# Patient Record
Sex: Female | Born: 1975
Health system: Southern US, Community
[De-identification: ages and names within clinical notes are randomized; demographics above are authoritative.]

## PROBLEM LIST (undated history)

## (undated) ENCOUNTER — Emergency Department (HOSPITAL_COMMUNITY): Admission: EM | Disposition: A | Discharge status: Home / Self Care | Payer: Self-pay

## (undated) DIAGNOSIS — M419 Scoliosis, unspecified: Secondary | ICD-10-CM

## (undated) DIAGNOSIS — R519 Headache, unspecified: Secondary | ICD-10-CM

## (undated) DIAGNOSIS — B009 Herpesviral infection, unspecified: Secondary | ICD-10-CM

## (undated) DIAGNOSIS — A630 Anogenital (venereal) warts: Secondary | ICD-10-CM

## (undated) DIAGNOSIS — R51 Headache: Secondary | ICD-10-CM

## (undated) DIAGNOSIS — S32009A Unspecified fracture of unspecified lumbar vertebra, initial encounter for closed fracture: Secondary | ICD-10-CM

## (undated) DIAGNOSIS — F909 Attention-deficit hyperactivity disorder, unspecified type: Secondary | ICD-10-CM

## (undated) DIAGNOSIS — N63 Unspecified lump in unspecified breast: Secondary | ICD-10-CM

## (undated) HISTORY — DX: Scoliosis, unspecified: M41.9

## (undated) HISTORY — DX: Unspecified lump in unspecified breast: N63.0

## (undated) HISTORY — DX: Anogenital (venereal) warts: A63.0

## (undated) HISTORY — DX: Attention-deficit hyperactivity disorder, unspecified type: F90.9

## (undated) HISTORY — DX: Unspecified fracture of unspecified lumbar vertebra, initial encounter for closed fracture: S32.009A

## (undated) HISTORY — DX: Headache: R51

## (undated) HISTORY — DX: Headache, unspecified: R51.9

## (undated) HISTORY — DX: Herpesviral infection, unspecified: B00.9

---
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
## 2004-04-04 ENCOUNTER — Ambulatory Visit: Payer: Self-pay | Admitting: Family Medicine

## 2004-04-28 ENCOUNTER — Other Ambulatory Visit: Admission: RE | Admit: 2004-04-28 | Discharge: 2004-04-28 | Payer: Self-pay | Admitting: Family Medicine

## 2004-04-28 ENCOUNTER — Ambulatory Visit: Payer: Self-pay | Admitting: Family Medicine

## 2004-05-01 ENCOUNTER — Encounter: Admission: RE | Admit: 2004-05-01 | Discharge: 2004-05-01 | Payer: Self-pay | Admitting: Family Medicine

## 2005-05-14 ENCOUNTER — Ambulatory Visit: Payer: Self-pay | Admitting: Family Medicine

## 2005-05-22 ENCOUNTER — Encounter: Admission: RE | Admit: 2005-05-22 | Discharge: 2005-05-22 | Payer: Self-pay | Admitting: Family Medicine

## 2005-06-04 ENCOUNTER — Ambulatory Visit: Payer: Self-pay | Admitting: Family Medicine

## 2005-06-10 ENCOUNTER — Encounter: Payer: Self-pay | Admitting: Family Medicine

## 2005-06-10 ENCOUNTER — Ambulatory Visit: Payer: Self-pay | Admitting: Family Medicine

## 2005-06-10 ENCOUNTER — Other Ambulatory Visit: Admission: RE | Admit: 2005-06-10 | Discharge: 2005-06-10 | Payer: Self-pay | Admitting: Family Medicine

## 2005-06-25 ENCOUNTER — Ambulatory Visit: Payer: Self-pay | Admitting: Family Medicine

## 2006-05-24 ENCOUNTER — Encounter: Admission: RE | Admit: 2006-05-24 | Discharge: 2006-05-24 | Payer: Self-pay | Admitting: Family Medicine

## 2006-06-07 ENCOUNTER — Ambulatory Visit: Payer: Self-pay | Admitting: Family Medicine

## 2006-06-07 LAB — CONVERTED CEMR LAB
ALT: 15 units/L (ref 0–40)
AST: 17 units/L (ref 0–37)
Albumin: 3.8 g/dL (ref 3.5–5.2)
Alkaline Phosphatase: 33 units/L — ABNORMAL LOW (ref 39–117)
BUN: 9 mg/dL (ref 6–23)
Basophils Absolute: 0.1 10*3/uL (ref 0.0–0.1)
Basophils Relative: 1 % (ref 0.0–1.0)
CO2: 29 meq/L (ref 19–32)
Calcium: 9.5 mg/dL (ref 8.4–10.5)
Chloride: 107 meq/L (ref 96–112)
Chol/HDL Ratio, serum: 5.2
Cholesterol: 254 mg/dL (ref 0–200)
Creatinine, Ser: 0.8 mg/dL (ref 0.4–1.2)
Eosinophil percent: 1.4 % (ref 0.0–5.0)
GFR calc non Af Amer: 91 mL/min
Glomerular Filtration Rate, Af Am: 110 mL/min/{1.73_m2}
Glucose, Bld: 86 mg/dL (ref 70–99)
HCT: 38.4 % (ref 36.0–46.0)
HDL: 48.4 mg/dL (ref >39.0)
Hemoglobin: 12.7 g/dL (ref 12.0–15.0)
LDL DIRECT: 151.9 mg/dL
Lymphocytes Relative: 40.9 % (ref 12.0–46.0)
MCHC: 33.1 g/dL (ref 30.0–36.0)
MCV: 91.8 fL (ref 78.0–100.0)
Monocytes Absolute: 0.5 10*3/uL (ref 0.2–0.7)
Monocytes Relative: 6.3 % (ref 3.0–11.0)
Neutro Abs: 3.7 10*3/uL (ref 1.4–7.7)
Neutrophils Relative %: 50.4 % (ref 43.0–77.0)
Platelets: 286 10*3/uL (ref 150–400)
Potassium: 4.2 meq/L (ref 3.5–5.1)
RBC: 4.19 M/uL (ref 3.87–5.11)
RDW: 13 % (ref 11.5–14.6)
Sodium: 142 meq/L (ref 135–145)
TSH: 2.21 microintl units/mL (ref 0.35–5.50)
Total Bilirubin: 0.5 mg/dL (ref 0.3–1.2)
Total Protein: 6.9 g/dL (ref 6.0–8.3)
Triglyceride fasting, serum: 295 mg/dL (ref 0–149)
VLDL: 59 mg/dL — ABNORMAL HIGH (ref 0–40)
WBC: 7.4 10*3/uL (ref 4.5–10.5)

## 2006-06-14 ENCOUNTER — Encounter: Payer: Self-pay | Admitting: Family Medicine

## 2006-06-14 ENCOUNTER — Ambulatory Visit: Payer: Self-pay | Admitting: Family Medicine

## 2006-06-14 ENCOUNTER — Other Ambulatory Visit: Admission: RE | Admit: 2006-06-14 | Discharge: 2006-06-14 | Payer: Self-pay | Admitting: Family Medicine

## 2006-11-29 ENCOUNTER — Ambulatory Visit: Payer: Self-pay | Admitting: Family Medicine

## 2006-11-29 LAB — CONVERTED CEMR LAB
Cholesterol: 227 mg/dL (ref 0–200)
Direct LDL: 134 mg/dL
HDL: 43.3 mg/dL (ref >39.0)
Total CHOL/HDL Ratio: 5.2
Triglycerides: 283 mg/dL (ref 0–149)
VLDL: 57 mg/dL — ABNORMAL HIGH (ref 0–40)

## 2007-06-03 ENCOUNTER — Encounter: Admission: RE | Admit: 2007-06-03 | Discharge: 2007-06-03 | Payer: Self-pay | Admitting: Family Medicine

## 2007-06-03 ENCOUNTER — Encounter: Payer: Self-pay | Admitting: Family Medicine

## 2007-06-09 ENCOUNTER — Telehealth: Payer: Self-pay | Admitting: Family Medicine

## 2007-06-17 ENCOUNTER — Encounter: Payer: Self-pay | Admitting: Internal Medicine

## 2007-06-17 ENCOUNTER — Ambulatory Visit: Payer: Self-pay | Admitting: Family Medicine

## 2007-06-17 ENCOUNTER — Other Ambulatory Visit: Admission: RE | Admit: 2007-06-17 | Discharge: 2007-06-17 | Payer: Self-pay | Admitting: Internal Medicine

## 2007-06-20 ENCOUNTER — Telehealth: Payer: Self-pay | Admitting: Family Medicine

## 2007-06-20 LAB — CONVERTED CEMR LAB
ALT: 20 units/L (ref 0–35)
AST: 18 units/L (ref 0–37)
Albumin: 4.2 g/dL (ref 3.5–5.2)
Alkaline Phosphatase: 35 units/L — ABNORMAL LOW (ref 39–117)
BUN: 8 mg/dL (ref 6–23)
Basophils Absolute: 0 10*3/uL (ref 0.0–0.1)
Basophils Relative: 0.6 % (ref 0.0–1.0)
Bilirubin, Direct: 0.1 mg/dL (ref 0.0–0.3)
CO2: 29 meq/L (ref 19–32)
Calcium: 10.1 mg/dL (ref 8.4–10.5)
Chloride: 106 meq/L (ref 96–112)
Cholesterol: 215 mg/dL (ref 0–200)
Creatinine, Ser: 0.9 mg/dL (ref 0.4–1.2)
Direct LDL: 145.9 mg/dL
Eosinophils Absolute: 0.1 10*3/uL (ref 0.0–0.6)
Eosinophils Relative: 0.7 % (ref 0.0–5.0)
GFR calc Af Amer: 95 mL/min
GFR calc non Af Amer: 79 mL/min
Glucose, Bld: 80 mg/dL (ref 70–99)
HCT: 34.8 % — ABNORMAL LOW (ref 36.0–46.0)
HDL: 46 mg/dL (ref >39.0)
Hemoglobin: 12.2 g/dL (ref 12.0–15.0)
Lymphocytes Relative: 33.9 % (ref 12.0–46.0)
MCHC: 35.1 g/dL (ref 30.0–36.0)
MCV: 92 fL (ref 78.0–100.0)
Monocytes Absolute: 0.5 10*3/uL (ref 0.2–0.7)
Monocytes Relative: 6.3 % (ref 3.0–11.0)
Neutro Abs: 4.2 10*3/uL (ref 1.4–7.7)
Neutrophils Relative %: 58.5 % (ref 43.0–77.0)
Platelets: 343 10*3/uL (ref 150–400)
Potassium: 4.8 meq/L (ref 3.5–5.1)
RBC: 3.78 M/uL — ABNORMAL LOW (ref 3.87–5.11)
RDW: 12.1 % (ref 11.5–14.6)
Sodium: 143 meq/L (ref 135–145)
TSH: 1.35 microintl units/mL (ref 0.35–5.50)
Total Bilirubin: 0.7 mg/dL (ref 0.3–1.2)
Total CHOL/HDL Ratio: 4.7
Total Protein: 6.8 g/dL (ref 6.0–8.3)
Triglycerides: 108 mg/dL (ref 0–149)
VLDL: 22 mg/dL (ref 0–40)
WBC: 7.3 10*3/uL (ref 4.5–10.5)

## 2007-06-24 ENCOUNTER — Ambulatory Visit: Payer: Self-pay | Admitting: Family Medicine

## 2007-06-24 DIAGNOSIS — J029 Acute pharyngitis, unspecified: Secondary | ICD-10-CM | POA: Insufficient documentation

## 2007-06-24 LAB — CONVERTED CEMR LAB: Rapid Strep: POSITIVE

## 2008-03-01 ENCOUNTER — Ambulatory Visit: Payer: Self-pay | Admitting: Family Medicine

## 2008-03-01 DIAGNOSIS — R209 Unspecified disturbances of skin sensation: Secondary | ICD-10-CM

## 2008-03-07 ENCOUNTER — Encounter: Payer: Self-pay | Admitting: Family Medicine

## 2008-04-04 ENCOUNTER — Telehealth: Payer: Self-pay | Admitting: Family Medicine

## 2008-06-18 ENCOUNTER — Encounter: Admission: RE | Admit: 2008-06-18 | Discharge: 2008-06-18 | Payer: Self-pay | Admitting: Family Medicine

## 2008-06-18 ENCOUNTER — Other Ambulatory Visit: Admission: RE | Admit: 2008-06-18 | Discharge: 2008-06-18 | Payer: Self-pay | Admitting: Family Medicine

## 2008-06-18 ENCOUNTER — Encounter: Payer: Self-pay | Admitting: Family Medicine

## 2008-06-18 ENCOUNTER — Ambulatory Visit: Payer: Self-pay | Admitting: Family Medicine

## 2008-06-22 ENCOUNTER — Encounter: Payer: Self-pay | Admitting: Family Medicine

## 2008-06-23 LAB — CONVERTED CEMR LAB
ALT: 13 units/L (ref 0–35)
AST: 17 units/L (ref 0–37)
Albumin: 4.2 g/dL (ref 3.5–5.2)
Alkaline Phosphatase: 32 units/L — ABNORMAL LOW (ref 39–117)
BUN: 9 mg/dL (ref 6–23)
Basophils Absolute: 0.1 10*3/uL (ref 0.0–0.1)
Basophils Relative: 1.4 % (ref 0.0–3.0)
Bilirubin, Direct: 0.1 mg/dL (ref 0.0–0.3)
CO2: 31 meq/L (ref 19–32)
Calcium: 9.8 mg/dL (ref 8.4–10.5)
Chloride: 105 meq/L (ref 96–112)
Cholesterol: 203 mg/dL (ref 0–200)
Creatinine, Ser: 0.8 mg/dL (ref 0.4–1.2)
Direct LDL: 123.2 mg/dL
Eosinophils Absolute: 0.1 10*3/uL (ref 0.0–0.7)
Eosinophils Relative: 1.1 % (ref 0.0–5.0)
GFR calc Af Amer: 108 mL/min
GFR calc non Af Amer: 90 mL/min
Glucose, Bld: 88 mg/dL (ref 70–99)
HCT: 38 % (ref 36.0–46.0)
HDL: 46.3 mg/dL (ref >39.0)
Hemoglobin: 13.1 g/dL (ref 12.0–15.0)
Lymphocytes Relative: 38.4 % (ref 12.0–46.0)
MCHC: 34.4 g/dL (ref 30.0–36.0)
MCV: 93.6 fL (ref 78.0–100.0)
Monocytes Absolute: 0.4 10*3/uL (ref 0.1–1.0)
Monocytes Relative: 6 % (ref 3.0–12.0)
Neutro Abs: 4 10*3/uL (ref 1.4–7.7)
Neutrophils Relative %: 53.1 % (ref 43.0–77.0)
Platelets: 296 10*3/uL (ref 150–400)
Potassium: 4.8 meq/L (ref 3.5–5.1)
RBC: 4.06 M/uL (ref 3.87–5.11)
RDW: 12.5 % (ref 11.5–14.6)
Sodium: 142 meq/L (ref 135–145)
TSH: 1.94 microintl units/mL (ref 0.35–5.50)
Total Bilirubin: 0.9 mg/dL (ref 0.3–1.2)
Total CHOL/HDL Ratio: 4.4
Total Protein: 7.4 g/dL (ref 6.0–8.3)
Triglycerides: 208 mg/dL (ref 0–149)
VLDL: 42 mg/dL — ABNORMAL HIGH (ref 0–40)
WBC: 7.5 10*3/uL (ref 4.5–10.5)

## 2008-07-06 ENCOUNTER — Telehealth: Payer: Self-pay | Admitting: Internal Medicine

## 2008-08-10 ENCOUNTER — Telehealth: Payer: Self-pay | Admitting: Family Medicine

## 2008-08-15 ENCOUNTER — Ambulatory Visit: Payer: Self-pay | Admitting: Family Medicine

## 2008-08-15 DIAGNOSIS — IMO0002 Reserved for concepts with insufficient information to code with codable children: Secondary | ICD-10-CM | POA: Insufficient documentation

## 2008-10-30 ENCOUNTER — Telehealth (INDEPENDENT_AMBULATORY_CARE_PROVIDER_SITE_OTHER): Payer: Self-pay | Admitting: *Deleted

## 2008-10-30 ENCOUNTER — Ambulatory Visit: Payer: Self-pay | Admitting: Family Medicine

## 2008-10-30 DIAGNOSIS — N63 Unspecified lump in unspecified breast: Secondary | ICD-10-CM

## 2008-11-01 ENCOUNTER — Encounter: Admission: RE | Admit: 2008-11-01 | Discharge: 2008-11-01 | Payer: Self-pay | Admitting: Family Medicine

## 2008-11-05 ENCOUNTER — Telehealth: Payer: Self-pay | Admitting: Family Medicine

## 2008-11-29 ENCOUNTER — Encounter: Payer: Self-pay | Admitting: Family Medicine

## 2009-03-11 ENCOUNTER — Telehealth: Payer: Self-pay | Admitting: Family Medicine

## 2009-04-29 ENCOUNTER — Ambulatory Visit: Payer: Self-pay | Admitting: Family Medicine

## 2009-04-29 DIAGNOSIS — M4120 Other idiopathic scoliosis, site unspecified: Secondary | ICD-10-CM

## 2009-04-29 DIAGNOSIS — M419 Scoliosis, unspecified: Secondary | ICD-10-CM | POA: Insufficient documentation

## 2009-04-29 DIAGNOSIS — M545 Low back pain, unspecified: Secondary | ICD-10-CM | POA: Insufficient documentation

## 2009-04-29 LAB — CONVERTED CEMR LAB
Bilirubin Urine: NEGATIVE
Glucose, Urine, Semiquant: NEGATIVE
Nitrite: NEGATIVE
Protein, U semiquant: NEGATIVE
Specific Gravity, Urine: 1.02
Urobilinogen, UA: 1
pH: 7

## 2009-07-31 ENCOUNTER — Other Ambulatory Visit: Admission: RE | Admit: 2009-07-31 | Discharge: 2009-07-31 | Payer: Self-pay | Admitting: Family Medicine

## 2009-07-31 ENCOUNTER — Ambulatory Visit: Payer: Self-pay | Admitting: Family Medicine

## 2009-07-31 LAB — HM PAP SMEAR

## 2009-08-01 LAB — CONVERTED CEMR LAB
ALT: 14 units/L (ref 0–35)
AST: 22 units/L (ref 0–37)
Albumin: 4.5 g/dL (ref 3.5–5.2)
Alkaline Phosphatase: 38 units/L — ABNORMAL LOW (ref 39–117)
BUN: 9 mg/dL (ref 6–23)
Basophils Absolute: 0.1 10*3/uL (ref 0.0–0.1)
Basophils Relative: 0.7 % (ref 0.0–3.0)
Bilirubin, Direct: 0.1 mg/dL (ref 0.0–0.3)
CO2: 30 meq/L (ref 19–32)
Calcium: 9.8 mg/dL (ref 8.4–10.5)
Chloride: 106 meq/L (ref 96–112)
Cholesterol: 207 mg/dL — ABNORMAL HIGH (ref 0–200)
Creatinine, Ser: 0.8 mg/dL (ref 0.4–1.2)
Direct LDL: 142.4 mg/dL
Eosinophils Absolute: 0.1 10*3/uL (ref 0.0–0.7)
Eosinophils Relative: 0.8 % (ref 0.0–5.0)
GFR calc non Af Amer: 88.72 mL/min (ref >60)
Glucose, Bld: 83 mg/dL (ref 70–99)
HCT: 38.7 % (ref 36.0–46.0)
HDL: 59.2 mg/dL (ref >39.00)
Hemoglobin: 13.1 g/dL (ref 12.0–15.0)
Lymphocytes Relative: 33.6 % (ref 12.0–46.0)
Lymphs Abs: 3.6 10*3/uL (ref 0.7–4.0)
MCHC: 33.7 g/dL (ref 30.0–36.0)
MCV: 96 fL (ref 78.0–100.0)
Monocytes Absolute: 0.6 10*3/uL (ref 0.1–1.0)
Monocytes Relative: 5.5 % (ref 3.0–12.0)
Neutro Abs: 6.2 10*3/uL (ref 1.4–7.7)
Neutrophils Relative %: 59.4 % (ref 43.0–77.0)
Platelets: 271 10*3/uL (ref 150.0–400.0)
Potassium: 4 meq/L (ref 3.5–5.1)
RBC: 4.03 M/uL (ref 3.87–5.11)
RDW: 13.1 % (ref 11.5–14.6)
Sodium: 143 meq/L (ref 135–145)
TSH: 1.34 microintl units/mL (ref 0.35–5.50)
Total Bilirubin: 0.7 mg/dL (ref 0.3–1.2)
Total CHOL/HDL Ratio: 3
Total Protein: 7.4 g/dL (ref 6.0–8.3)
Triglycerides: 93 mg/dL (ref 0.0–149.0)
VLDL: 18.6 mg/dL (ref 0.0–40.0)
WBC: 10.6 10*3/uL — ABNORMAL HIGH (ref 4.5–10.5)

## 2009-08-06 ENCOUNTER — Encounter: Payer: Self-pay | Admitting: Family Medicine

## 2009-08-06 LAB — CONVERTED CEMR LAB: Pap Smear: NEGATIVE

## 2009-08-26 ENCOUNTER — Telehealth: Payer: Self-pay | Admitting: Family Medicine

## 2009-10-10 ENCOUNTER — Ambulatory Visit: Payer: Self-pay | Admitting: Family Medicine

## 2009-10-10 ENCOUNTER — Telehealth: Payer: Self-pay | Admitting: Family Medicine

## 2009-10-10 DIAGNOSIS — F909 Attention-deficit hyperactivity disorder, unspecified type: Secondary | ICD-10-CM | POA: Insufficient documentation

## 2009-10-31 ENCOUNTER — Ambulatory Visit: Payer: Self-pay | Admitting: Family Medicine

## 2010-02-19 ENCOUNTER — Encounter: Admission: RE | Admit: 2010-02-19 | Discharge: 2010-02-19 | Payer: Self-pay | Admitting: Family Medicine

## 2010-02-19 LAB — HM MAMMOGRAPHY

## 2010-02-27 ENCOUNTER — Telehealth: Payer: Self-pay | Admitting: Family Medicine

## 2010-03-05 ENCOUNTER — Ambulatory Visit: Payer: Self-pay | Admitting: Family Medicine

## 2010-03-05 DIAGNOSIS — F411 Generalized anxiety disorder: Secondary | ICD-10-CM | POA: Insufficient documentation

## 2010-03-31 ENCOUNTER — Telehealth: Payer: Self-pay | Admitting: Family Medicine

## 2010-06-21 ENCOUNTER — Encounter: Payer: Self-pay | Admitting: Family Medicine

## 2010-06-22 ENCOUNTER — Encounter: Payer: Self-pay | Admitting: Family Medicine

## 2010-07-03 NOTE — Progress Notes (Signed)
Summary: Patient swelling after alcohol consumption  Phone Note Call from Patient Call back at Home Phone [phone]   Caller: Patient Reason for Call: Talk to Nurse Summary of Call: Patient has a quetion regarding swelling of eyes and lips after consumption of alcohol. Initial call taken by: Everrett Coombe,  August 26, 2009 8:59 AM  Follow-up for Phone Call        almost every time she has alcohol in the last few months- liquor and especially berr, she wakes up the next morning with swollen lips and eyelids. Does this sound like allergy?  No trouble breathing or rash. Follow-up by: Raechel Ache, RN,  August 26, 2009 9:27 AM  Additional Follow-up for Phone Call Additional follow up Details #1::        yes it does sound like an allergic reaction. The best advice or course is to stop drinking alcohol, but if she wants to try an experiment she could take some Benadryl the next time to prevent this Additional Follow-up by: Nelwyn Salisbury MD,  August 26, 2009 9:38 AM    Additional Follow-up for Phone Call Additional follow up Details #2::    called. Follow-up by: Raechel Ache, RN,  August 26, 2009 9:47 AM

## 2010-07-03 NOTE — Assessment & Plan Note (Signed)
Summary: cpx-pap-will fast//ccm   Vital Signs:  Patient profile:   35 year old female Menstrual status:  regular LMP:     07/08/2009 Height:      65 inches Weight:      137 pounds BMI:     22.88 Temp:     98.3 degrees F oral Pulse rate:   84 / minute Pulse rhythm:   regular BP sitting:   114 / 76  (left arm) Cuff size:   regular  Vitals Entered By: Alfred Levins, CMA (July 31, 2009 9:14 AM) CC: cpx with pap, fasting LMP (date): 07/08/2009     Menstrual Status regular Enter LMP: 07/08/2009 Last PAP Result NEGATIVE FOR INTRAEPITHELIAL LESIONS OR MALIGNANCY.   History of Present Illness: 35 yr old female for a cpx. She feels great and has no concerns. She had some back pain last year, and we gave her some etodolac to try. This was very effective, and she uses this once in awhile. She is doing the P90X workout program at home, and she tolerates this well. After her breast lumpappeared and then disappeared last year, she has been doing frequent self breast exams. She has never felt any lumps since then.   Current Medications (verified): 1)  Etodolac 500 Mg Tabs (Etodolac) .... Two Times A Day As Needed Pain  Allergies (verified): No Known Drug Allergies  Past History:  Past Medical History: svd x 2 fractured lumbar vertebra 1995 scoliosis left breast nodule by exam and [place] in July 2010, seen by Dr. Dwain Sarna, resolved on its own  Past Surgical History: Reviewed history from 06/17/2007 and no changes required. Denies surgical history  Family History: Reviewed history from 10/30/2008 and no changes required. Family History of Alcoholism/Addiction Family History Breast cancer 1st degree relative <50 (mother at age                                                                9 and an aunt at age 17)  Family History High cholesterol  Social History: Reviewed history from 06/17/2007 and no changes required. Divorced Current Smoker Alcohol use-yes  Review of  Systems  The patient denies anorexia, fever, weight loss, weight gain, vision loss, decreased hearing, hoarseness, chest pain, syncope, dyspnea on exertion, peripheral edema, prolonged cough, headaches, hemoptysis, abdominal pain, melena, hematochezia, severe indigestion/heartburn, hematuria, incontinence, genital sores, muscle weakness, suspicious skin lesions, transient blindness, difficulty walking, depression, unusual weight change, abnormal bleeding, enlarged lymph nodes, angioedema, breast masses, and testicular masses.    Physical Exam  General:  Well-developed,well-nourished,in no acute distress; alert,appropriate and cooperative throughout examination Head:  Normocephalic and atraumatic without obvious abnormalities. No apparent alopecia or balding. Eyes:  No corneal or conjunctival inflammation noted. EOMI. Perrla. Funduscopic exam benign, without hemorrhages, exudates or papilledema. Vision grossly normal. Ears:  External ear exam shows no significant lesions or deformities.  Otoscopic examination reveals clear canals, tympanic membranes are intact bilaterally without bulging, retraction, inflammation or discharge. Hearing is grossly normal bilaterally. Nose:  External nasal examination shows no deformity or inflammation. Nasal mucosa are pink and moist without lesions or exudates. Mouth:  Oral mucosa and oropharynx without lesions or exudates.  Teeth in good repair. Neck:  No deformities, masses, or tenderness noted. Chest Wall:  No deformities, masses,  or tenderness noted. Breasts:  No mass, nodules, thickening, tenderness, bulging, retraction, inflamation, nipple discharge or skin changes noted.   Lungs:  Normal respiratory effort, chest expands symmetrically. Lungs are clear to auscultation, no crackles or wheezes. Heart:  Normal rate and regular rhythm. S1 and S2 normal without gallop, murmur, click, rub or other extra sounds. Abdomen:  Bowel sounds positive,abdomen soft and  non-tender without masses, organomegaly or hernias noted. Genitalia:  Pelvic Exam:        External: normal female genitalia without lesions or masses        Vagina: normal without lesions or masses        Cervix: normal without lesions or masses        Adnexa: normal bimanual exam without masses or fullness        Uterus: normal by palpation        Pap smear: performed Msk:  No deformity or scoliosis noted of thoracic or lumbar spine.   Pulses:  R and L carotid,radial,femoral,dorsalis pedis and posterior tibial pulses are full and equal bilaterally Extremities:  No clubbing, cyanosis, edema, or deformity noted with normal full range of motion of all joints.   Neurologic:  No cranial nerve deficits noted. Station and gait are normal. Plantar reflexes are down-going bilaterally. DTRs are symmetrical throughout. Sensory, motor and coordinative functions appear intact. Skin:  Intact without suspicious lesions or rashes Cervical Nodes:  No lymphadenopathy noted Axillary Nodes:  No palpable lymphadenopathy Inguinal Nodes:  No significant adenopathy Psych:  Cognition and judgment appear intact. Alert and cooperative with normal attention span and concentration. No apparent delusions, illusions, hallucinations   Impression & Recommendations:  Problem # 1:  WELL ADULT EXAM (ICD-V70.0)  Orders: UA Dipstick w/o Micro (automated)  (81003) Venipuncture (29562) TLB-Lipid Panel (80061-LIPID) TLB-BMP (Basic Metabolic Panel-BMET) (80048-METABOL) TLB-CBC Platelet - w/Differential (85025-CBCD) TLB-Hepatic/Liver Function Pnl (80076-HEPATIC) TLB-TSH (Thyroid Stimulating Hormone) (84443-TSH)  Complete Medication List: 1)  Etodolac 500 Mg Tabs (Etodolac) .... Two times a day as needed pain  Patient Instructions: 1)  Get labs today. I reccomended she get a one year follow up mammogram this June. She will continue monthly self exams.  Appended Document: cpx-pap-will fast//ccm  Laboratory Results    Urine Tests    Routine Urinalysis   Color: yellow Appearance: Clear Glucose: negative   (Normal Range: Negative) Bilirubin: negative   (Normal Range: Negative) Ketone: negative   (Normal Range: Negative) Spec. Gravity: <1.005   (Normal Range: 1.003-1.035) Blood: 2+   (Normal Range: Negative) pH: 5.0   (Normal Range: 5.0-8.0) Protein: negative   (Normal Range: Negative) Urobilinogen: 0.2   (Normal Range: 0-1) Nitrite: negative   (Normal Range: Negative) Leukocyte Esterace: negative   (Normal Range: Negative)    Comments: Rita Ohara  July 31, 2009 11:57 AM

## 2010-07-03 NOTE — Progress Notes (Signed)
Summary: ? about meds.  Phone Note Call from Patient   Caller: Patient Call For: Nelwyn Salisbury MD Summary of Call: [phone] Pt was prescribed Strattera today, but read the literature and is afraid to take this meds due to mood swings.  She wants to ask Dr. Clent Ridges if there is anything else, or should she give it a try.  She is already moody and angry. Initial call taken by: Lynann [place] CMA,  Oct 10, 2009 12:53 PM  Follow-up for Phone Call        go ahead and try it. I have not found this to be a problem for anyone I have given it to Follow-up by: Nelwyn Salisbury MD,  Oct 11, 2009 8:28 AM  Additional Follow-up for Phone Call Additional follow up Details #1::        [facility] Additional Follow-up by: Lynann [place] CMA,  Oct 11, 2009 10:14 AM    Additional Follow-up for Phone Call Additional follow up Details #2::    spoke with pt r/t Dr. Claris Che instructions to try med. Pt states she took it this am with problems, feels good. KIK Follow-up by: Duard Brady LPN,  Oct 11, 2009 5:17 PM

## 2010-07-03 NOTE — Progress Notes (Signed)
Summary: hit head  Phone Note Call from Patient Call back at Home Phone [phone]   Caller: vm Summary of Call: Bumped head pretty bad this am.  Go over what's going on now.  Tired from lack of coffee or something else going on.  Groggy & not thinking straight.  Had couple cups coffee.  Eyes look fine.  Ibuprofen.  Got lost on way to work this am.  Goose egg back of head.     Called patient.  No dizziness, nausea, but getting lost on way to work concerned her.  Declined ov this am, in [place], can't get here by 10:30.  Have someone stay with her 24-48 hrs.  ER for change worsening symptoms.  Call report tomorrow.  Rudy Jew, RN  March 31, 2010 10:05 AM  Initial call taken by: Rudy Jew, RN,  March 31, 2010 9:58 AM  Follow-up for Phone Call        agreed Follow-up by: Nelwyn Salisbury MD,  March 31, 2010 10:39 AM

## 2010-07-03 NOTE — Letter (Signed)
Summary: Results Follow-up Letter  [place] [facility]  [address] [place], [place] [address]   Phone: [phone]  Fax: [phone]    08/06/2009  [address] [place], [place]  [address]  Dear Ms. Toni Edwards,   The following are the results of your recent test(s):  Test     Result     Pap Smear    Normal___X____  Not Normal_____       Comments: _________________________________________________________ Cholesterol LDL(Bad cholesterol):          Your goal is less than:         HDL (Good cholesterol):        Your goal is more than: _________________________________________________________ Other Tests:   _________________________________________________________  Please call for an appointment Or ______Repeat in one year_________________________________ _________________________________________________________ _________________________________________________________  Sincerely,  Gershon Crane, MD [place] [facility]

## 2010-07-03 NOTE — Assessment & Plan Note (Signed)
Summary: to discuss ?hormone imblance/njr   Vital Signs:  Patient profile:   35 year old female Menstrual status:  regular Weight:      130 pounds O2 Sat:      98 % Temp:     98.6 degrees F Pulse rate:   84 / minute BP sitting:   114 / 76  (left arm)  Vitals Entered By: Pura Spice, RN (March 05, 2010 9:34 AM) CC: ?hormone imbalance  wants to be aseessed for depression states her friends are telling her to be ck'd for depression. states "I am not depressed"    History of Present Illness: Here to discuss a tough period in her life. After being married for 10 years, she is now considering leaving her husband and getting a divorce. They have been having problems for several months, and this appears to be a mutual decision. She admits to beoing stressed but thinks she is dealing with things well. her friends and family think she is "depressed" and asked her to talk to me about it. She denies any crying spells or times of extreme hopelessness. Her appetite is normal, and she sleeps well. She is happy with Strattera and plans to stay on this.   Allergies (verified): No Known Drug Allergies  Past History:  Past Medical History: Reviewed history from 10/31/2009 and no changes required. svd x 2 fractured lumbar vertebra 1995 scoliosis left breast nodule by exam and [place] in July 2010, seen by Dr. Dwain Sarna, resolved on its own ADHD  Review of Systems  The patient denies anorexia, fever, weight loss, weight gain, vision loss, decreased hearing, hoarseness, chest pain, syncope, dyspnea on exertion, peripheral edema, prolonged cough, headaches, hemoptysis, abdominal pain, melena, hematochezia, severe indigestion/heartburn, hematuria, incontinence, genital sores, muscle weakness, suspicious skin lesions, transient blindness, difficulty walking, depression, unusual weight change, abnormal bleeding, enlarged lymph nodes, angioedema, breast masses, and testicular masses.    Physical  Exam  General:  Well-developed,well-nourished,in no acute distress; alert,appropriate and cooperative throughout examination Psych:  Cognition and judgment appear intact. Alert and cooperative with normal attention span and concentration. No apparent delusions, illusions, hallucinations   Impression & Recommendations:  Problem # 1:  ANXIETY STATE, UNSPECIFIED (ICD-300.00)  Complete Medication List: 1)  Strattera 100 Mg Caps (Atomoxetine hcl) .... Once daily  Patient Instructions: 1)  We spent 35 minutes discussing this, and she does not appear to be depressed to me. I agreed with her that she seems to be dealing with a stressful situation fairly well. I suggested she talk to a marriage counselor.  2)  Please schedule a follow-up appointment as needed .

## 2010-07-03 NOTE — Progress Notes (Signed)
Summary: ?concerning bcp  Phone Note Call from Patient Call back at Home Phone [phone]   Caller: Patient Call For: Nelwyn Salisbury MD Summary of Call: Pt is no longer take bcp she stopped 8 months ago.  She has a question concerning hormone inblance. please advice Initial call taken by: Heron Sabins,  February 27, 2010 8:53 AM  Follow-up for Phone Call        make an OV to discuss this  Follow-up by: Nelwyn Salisbury MD,  February 27, 2010 10:13 AM  Additional Follow-up for Phone Call Additional follow up Details #1::        ov 03-05-2010 945am Additional Follow-up by: Heron Sabins,  February 27, 2010 10:20 AM

## 2010-07-03 NOTE — Assessment & Plan Note (Signed)
Summary: 3 wk follow up/cjr   Vital Signs:  Patient profile:   35 year old female Menstrual status:  regular Weight:      130 pounds BMI:     21.71 BP sitting:   108 / 80  (left arm) Cuff size:   regular  Vitals Entered By: Raechel Ache, RN (October 31, 2009 8:39 AM) CC: 3 week f/u, doing ok.   History of Present Illness: Here to follow up on Strattera. She is pleased that this has helped her during the day with focus, and shw would like to continue it. No side effects. However it seems to run out mid-afternoon, and she gets back to having trouble like before. No effect on appetite or sleep.   Allergies: No Known Drug Allergies  Past History:  Past Medical History: svd x 2 fractured lumbar vertebra 1995 scoliosis left breast nodule by exam and [place] in July 2010, seen by Dr. Dwain Sarna, resolved on its own ADHD  Review of Systems  The patient denies anorexia, fever, weight loss, weight gain, vision loss, decreased hearing, hoarseness, chest pain, syncope, dyspnea on exertion, peripheral edema, prolonged cough, headaches, hemoptysis, abdominal pain, melena, hematochezia, severe indigestion/heartburn, hematuria, incontinence, genital sores, muscle weakness, suspicious skin lesions, transient blindness, difficulty walking, depression, unusual weight change, abnormal bleeding, enlarged lymph nodes, angioedema, breast masses, and testicular masses.    Physical Exam  General:  Well-developed,well-nourished,in no acute distress; alert,appropriate and cooperative throughout examination Psych:  Cognition and judgment appear intact. Alert and cooperative with normal attention span and concentration. No apparent delusions, illusions, hallucinations   Impression & Recommendations:  Problem # 1:  ADHD (ICD-314.01)  Complete Medication List: 1)  Strattera 100 Mg Caps (Atomoxetine hcl) .... Once daily  Patient Instructions: 1)  After discussing our options for 30 minutes, we decided to  increase her Strattera dose to 100 mg each morning. This should help it to last throughout her workday.  2)  Please schedule a follow-up appointment in 1 month.  Prescriptions: STRATTERA 100 MG CAPS (ATOMOXETINE HCL) once daily  #30 x 11   Entered and Authorized by:   Nelwyn Salisbury MD   Signed by:   Nelwyn Salisbury MD on 10/31/2009   Method used:   Electronically to        [facility]  [address] [phone]* (retail)       [address]       [place], [place]  [address]       Ph: [phone]       Fax: [phone]   RxID:   [id number]

## 2010-07-03 NOTE — Assessment & Plan Note (Signed)
Summary: difficulty focusing/dm   Vital Signs:  Patient profile:   35 year old female Menstrual status:  regular Weight:      134 pounds BP sitting:   110 / 80  (left arm) Cuff size:   regular  Vitals Entered By: Raechel Ache, RN (Oct 10, 2009 9:01 AM) CC: C/o having a hard time focusing.   History of Present Illness: Here to discuss problems with attention that are getting worse than ever. She does not remember having trouble as a child, but over the past few years this has become an issue. She describes zoning out during conversations and having to ask people tp repeat what they say. At work she can no longer keep up with her responsibilities, and oftern starts projects without ever finishing them. her bosses and her family have noticed this. She denies any anxiety or depression symptoms.   Allergies (verified): No Known Drug Allergies  Past History:  Past Medical History: Reviewed history from 07/31/2009 and no changes required. svd x 2 fractured lumbar vertebra 1995 scoliosis left breast nodule by exam and [place] in July 2010, seen by Dr. Dwain Sarna, resolved on its own  Review of Systems  The patient denies anorexia, fever, weight loss, weight gain, vision loss, decreased hearing, hoarseness, chest pain, syncope, dyspnea on exertion, peripheral edema, prolonged cough, headaches, hemoptysis, abdominal pain, melena, hematochezia, severe indigestion/heartburn, hematuria, incontinence, genital sores, muscle weakness, suspicious skin lesions, transient blindness, difficulty walking, depression, unusual weight change, abnormal bleeding, enlarged lymph nodes, angioedema, breast masses, and testicular masses.    Physical Exam  General:  Well-developed,well-nourished,in no acute distress; alert,appropriate and cooperative throughout examination Neurologic:  No cranial nerve deficits noted. Station and gait are normal. Plantar reflexes are down-going bilaterally. DTRs are symmetrical  throughout. Sensory, motor and coordinative functions appear intact. Psych:  Cognition and judgment appear intact. Alert and cooperative with normal attention span and concentration. No apparent delusions, illusions, hallucinations   Impression & Recommendations:  Problem # 1:  ADHD (ICD-314.01)  Complete Medication List: 1)  Strattera 60 Mg Caps (Atomoxetine hcl) .... Once daily  Patient Instructions: 1)  We discussed this for 30 minutes. Start on Strattera, and recheck in 3 weeks Prescriptions: STRATTERA 60 MG CAPS (ATOMOXETINE HCL) once daily  #30 x 5   Entered and Authorized by:   Nelwyn Salisbury MD   Signed by:   Nelwyn Salisbury MD on 10/10/2009   Method used:   [facility]        [facility]  [address] [phone]* (retail)       [address]       [place], [place]  [address]       Ph: [phone]       Fax: [phone]   RxID:   [id number]

## 2010-08-14 ENCOUNTER — Encounter: Payer: Self-pay | Admitting: Family Medicine

## 2010-08-14 ENCOUNTER — Other Ambulatory Visit: Payer: 59 | Admitting: Family Medicine

## 2010-08-14 DIAGNOSIS — Z0000 Encounter for general adult medical examination without abnormal findings: Secondary | ICD-10-CM

## 2010-08-14 DIAGNOSIS — E785 Hyperlipidemia, unspecified: Secondary | ICD-10-CM

## 2010-08-14 LAB — CBC WITH DIFFERENTIAL/PLATELET
Basophils Relative: 0.6 % (ref 0.0–3.0)
Eosinophils Absolute: 0.1 10*3/uL (ref 0.0–0.7)
Eosinophils Relative: 1.1 % (ref 0.0–5.0)
HCT: 41.5 % (ref 36.0–46.0)
Hemoglobin: 14.3 g/dL (ref 12.0–15.0)
Lymphocytes Relative: 28.4 % (ref 12.0–46.0)
Lymphs Abs: 3.5 10*3/uL (ref 0.7–4.0)
MCHC: 34.3 g/dL (ref 30.0–36.0)
MCV: 95.9 fl (ref 78.0–100.0)
Monocytes Absolute: 0.7 10*3/uL (ref 0.1–1.0)
Neutro Abs: 8 10*3/uL — ABNORMAL HIGH (ref 1.4–7.7)
Neutrophils Relative %: 64.1 % (ref 43.0–77.0)
Platelets: 243 10*3/uL (ref 150.0–400.0)
RDW: 13 % (ref 11.5–14.6)
WBC: 12.5 10*3/uL — ABNORMAL HIGH (ref 4.5–10.5)

## 2010-08-14 LAB — BASIC METABOLIC PANEL
BUN: 9 mg/dL (ref 6–23)
CO2: 30 mEq/L (ref 19–32)
Calcium: 9.8 mg/dL (ref 8.4–10.5)
Chloride: 105 mEq/L (ref 96–112)
Creatinine, Ser: 0.9 mg/dL (ref 0.4–1.2)
GFR: 80 mL/min (ref >60.00)
Glucose, Bld: 85 mg/dL (ref 70–99)
Potassium: 5.1 mEq/L (ref 3.5–5.1)

## 2010-08-14 LAB — LDL CHOLESTEROL, DIRECT: Direct LDL: 139.5 mg/dL

## 2010-08-14 LAB — LIPID PANEL
Cholesterol: 214 mg/dL — ABNORMAL HIGH (ref 0–200)
HDL: 45.1 mg/dL (ref >39.00)
Total CHOL/HDL Ratio: 5
Triglycerides: 159 mg/dL — ABNORMAL HIGH (ref 0.0–149.0)
VLDL: 31.8 mg/dL (ref 0.0–40.0)

## 2010-08-14 LAB — POCT URINALYSIS DIPSTICK
Bilirubin, UA: NEGATIVE
Glucose, UA: NEGATIVE
Ketones, UA: NEGATIVE
Leukocytes, UA: NEGATIVE
Nitrite, UA: NEGATIVE
Protein, UA: NEGATIVE
Spec Grav, UA: 1.02
pH, UA: 6

## 2010-08-14 LAB — TSH: TSH: 2.02 u[IU]/mL (ref 0.35–5.50)

## 2010-08-14 LAB — HEPATIC FUNCTION PANEL
ALT: 22 U/L (ref 0–35)
AST: 23 U/L (ref 0–37)
Albumin: 4.5 g/dL (ref 3.5–5.2)
Alkaline Phosphatase: 40 U/L (ref 39–117)
Bilirubin, Direct: 0.1 mg/dL (ref 0.0–0.3)
Total Bilirubin: 0.6 mg/dL (ref 0.3–1.2)
Total Protein: 7.2 g/dL (ref 6.0–8.3)

## 2010-08-21 ENCOUNTER — Encounter: Payer: Self-pay | Admitting: Family Medicine

## 2010-08-25 ENCOUNTER — Telehealth: Payer: Self-pay

## 2010-08-25 NOTE — Telephone Encounter (Signed)
Message copied by Madison Hickman on Mon Aug 25, 2010  8:33 AM ------      Message from: Dwaine Deter      Created: Fri Aug 22, 2010  5:26 PM       Normal except high chol. Watch the diet

## 2010-08-25 NOTE — Telephone Encounter (Signed)
Pt aware of lab results 

## 2010-10-09 ENCOUNTER — Other Ambulatory Visit (HOSPITAL_COMMUNITY)
Admission: RE | Admit: 2010-10-09 | Discharge: 2010-10-09 | Disposition: A | Discharge status: Home / Self Care | Payer: 59 | Source: Ambulatory Visit | Attending: Family Medicine | Admitting: Family Medicine

## 2010-10-09 ENCOUNTER — Ambulatory Visit (INDEPENDENT_AMBULATORY_CARE_PROVIDER_SITE_OTHER): Payer: 59 | Admitting: Family Medicine

## 2010-10-09 ENCOUNTER — Encounter: Payer: Self-pay | Admitting: Family Medicine

## 2010-10-09 VITALS — BP 98/78 | HR 73 | Temp 98.1°F | Resp 14 | Ht 65.0 in | Wt 136.0 lb

## 2010-10-09 DIAGNOSIS — Z01419 Encounter for gynecological examination (general) (routine) without abnormal findings: Secondary | ICD-10-CM | POA: Insufficient documentation

## 2010-10-09 DIAGNOSIS — R87619 Unspecified abnormal cytological findings in specimens from cervix uteri: Secondary | ICD-10-CM

## 2010-10-09 DIAGNOSIS — D229 Melanocytic nevi, unspecified: Secondary | ICD-10-CM

## 2010-10-09 DIAGNOSIS — IMO0002 Reserved for concepts with insufficient information to code with codable children: Secondary | ICD-10-CM

## 2010-10-09 DIAGNOSIS — D239 Other benign neoplasm of skin, unspecified: Secondary | ICD-10-CM

## 2010-10-09 DIAGNOSIS — Z0000 Encounter for general adult medical examination without abnormal findings: Secondary | ICD-10-CM

## 2010-10-09 MED ORDER — ETODOLAC 500 MG PO TABS: 500.0000 mg | ORAL_TABLET | Freq: Two times a day (BID) | ORAL | Status: DC

## 2010-10-09 MED ORDER — CYCLOBENZAPRINE HCL 10 MG PO TABS: 10.0000 mg | ORAL_TABLET | Freq: Three times a day (TID) | ORAL | Status: DC | PRN

## 2010-10-09 NOTE — Progress Notes (Signed)
Subjective:    Patient ID: Toni Edwards, female    DOB: 08/18/1975, 35 y.o.   MRN: [id number]  HPI 35 yr old female for a cpx. She feels well except for some occasional stiffness and pain in her back. She would like to have the same meds she used a few years ago to use when this flares up. She has divorced since I last saw her, and now she has a serious boyfriend. They are thinking about having a child. She has a dark mole on the back she wants me to check. She does not know if it is changing or not.    Review of Systems  Constitutional: Negative.  Negative for fever, diaphoresis, activity change, appetite change, fatigue and unexpected weight change.  HENT: Negative.  Negative for hearing loss, ear pain, nosebleeds, congestion, sore throat, trouble swallowing, neck pain, neck stiffness, voice change and tinnitus.   Eyes: Negative.  Negative for photophobia, pain, discharge, redness and visual disturbance.  Respiratory: Negative.  Negative for apnea, cough, choking, chest tightness, shortness of breath, wheezing and stridor.   Cardiovascular: Negative.  Negative for chest pain, palpitations and leg swelling.  Gastrointestinal: Negative.  Negative for nausea, vomiting, abdominal pain, diarrhea, constipation, blood in stool, abdominal distention and rectal pain.  Genitourinary: Negative.  Negative for dysuria, urgency, frequency, hematuria, flank pain, scrotal swelling, vaginal bleeding, vaginal discharge, enuresis, difficulty urinating, testicular pain, vaginal pain and menstrual problem.  Musculoskeletal: Negative.  Negative for myalgias, back pain, joint swelling, arthralgias and gait problem.  Skin: Negative.  Negative for color change, pallor, rash and wound.  Neurological: Negative.  Negative for dizziness, tremors, seizures, syncope, speech difficulty, weakness, light-headedness, numbness and headaches.  Hematological: Negative.  Negative for adenopathy. Does not bruise/bleed easily.    Psychiatric/Behavioral: Negative.  Negative for hallucinations, behavioral problems, confusion, sleep disturbance, dysphoric mood and agitation. The patient is not nervous/anxious.        Objective:   Physical Exam  Constitutional: She appears well-developed and well-nourished. No distress.  HENT:  Head: Normocephalic and atraumatic.  Right Ear: External ear normal.  Left Ear: External ear normal.  Nose: Nose normal.  Mouth/Throat: Oropharynx is clear and moist. No oropharyngeal exudate.  Eyes: Conjunctivae and EOM are normal. Pupils are equal, round, and reactive to light. Right eye exhibits no discharge. Left eye exhibits no discharge. No scleral icterus.  Neck: Normal range of motion. Neck supple. No JVD present. No thyromegaly present.  Cardiovascular: Normal rate, regular rhythm, normal heart sounds and intact distal pulses.  Exam reveals no gallop and no friction rub.   No murmur heard. Pulmonary/Chest: Effort normal and breath sounds normal. No stridor. No respiratory distress. She has no wheezes. She has no rales. She exhibits no tenderness.  Abdominal: Soft. Normal appearance and bowel sounds are normal. She exhibits no distension, no abdominal bruit, no ascites and no mass. There is no hepatosplenomegaly. There is no tenderness. There is no rigidity, no rebound and no guarding. No hernia.  Genitourinary: Rectum normal, vagina normal and uterus normal. No breast swelling, tenderness, discharge or bleeding. Cervix exhibits no motion tenderness, no discharge and no friability. Right adnexum displays no mass, no tenderness and no fullness. Left adnexum displays no mass, no tenderness and no fullness. No erythema, tenderness or bleeding around the vagina. No vaginal discharge found.  Musculoskeletal: Normal range of motion. She exhibits no edema and no tenderness.  Lymphadenopathy:    She has no cervical adenopathy.  Neurological: She is alert. She has  normal reflexes. No cranial nerve  deficit. She exhibits normal muscle tone. Coordination normal.  Skin: Skin is warm and dry. No rash noted. She is not diaphoretic. No erythema. No pallor.       She has a dark brown almost black macular lesion on the lower back which is variegated in color  Psychiatric: She has a normal mood and affect. Her behavior is normal. Judgment and thought content normal.          Assessment & Plan:  Well exam. Wrote for Flexeril and Etodolac for her back. Refer to Dermatology for the nevus.

## 2010-10-09 NOTE — Progress Notes (Signed)
Addended by: Burnard Leigh on: 10/09/2010 02:54 PM   Modules accepted: Orders

## 2010-10-09 NOTE — Progress Notes (Signed)
Addended by: Burnard Leigh on: 10/09/2010 12:02 PM   Modules accepted: Orders

## 2010-10-17 NOTE — Progress Notes (Signed)
Addended by: Kern Reap on: 10/17/2010 03:50 PM   Modules accepted: Orders

## 2010-10-17 NOTE — Assessment & Plan Note (Signed)
[facility] OFFICE NOTE   GWENDOLIN, BRIEL                            MRN:          [id number]  DATE:06/14/2006                            DOB:          07-May-1976    This is a 35 year old woman here for complete physical examination.  Only complaint today is of a lack of libido over the past year.  One  year ago, we switched from Depo-Provera shots to Yasmin daily.  She is  not sure if this had something to do with the loss of libido or not.  She denies any other particular stresses or other health problems.  Her  periods are quite regular.  For the past several years she has dealt  with almost daily tiny amounts of bilateral nipple discharge from her  breasts.  This has been thoroughly worked up with several mammograms,  which have all been normal.  In fact, she had another normal mammogram  on December 24 of last year.  Otherwise, she gets regular exercise, but  admits to not eating a very health diet.  For other details of her past  medical history, family history, social history, habits, Melvern Banker, I  refer you to our last physical note dated June 10, 2005.   ALLERGIES:  None.   CURRENT MEDICATIONS:  Yasmin daily.   OBJECTIVE:  Height 5 feet 5 inches, weight 147, BP 104/76, pulse 68 and  regular.  GENERAL:  She appears healthy.  SKIN:  Clear.  EYES:  Clear.  Sclerae clear.  OROPHARYNX:  Clear.  NECK:  Supple without lymphadenopathy or masses.  LUNGS:  Clear.  CARDIAC:  Regular rate and rhythm without gallops, murmurs, or rubs.  Distal pulses are full.  BREASTS AND AXILLAE:  Clear.  I am unable to express any discharge from  the nipples today.  ABDOMEN:  Soft, normal bowel sounds, nontender, no masses.  PELVIC:  External genitalia within normal limits.  Vagina is clear.  Cervix is clear.  Pap smear is obtained.  Uterus is not enlarged.  Adnexa no mass or tenderness.  EXTREMITIES:  No cyanosis,  clubbing, or edema.  NEUROLOGIC:  Grossly intact.   She was here for fasting labs on January 7.  These were remarkable only  for her lipid panel, which has become markedly abnormal compared to last  year.  Total cholesterol is up to 254, triglycerides are 295.  The LDL  is 59 and LDL is up to 152.  HDL is satisfactory at 48.   ASSESSMENT AND PLAN:  1. Complete physical.  We await results of her Pap smear.  2. Contraception.  I am not sure if her loss of libido is due to her      pill or not, but in any event, we will switch to a tricyclic      regimen.  We will stop Yasmin and begin Ortho Tri-Cyclen 28      day packs to take 1 daily.  3. Hyperlipidemia.  She is going to watch her diet more strictly and  we will check another lipid panel in 6 months.     Tera Mater. Clent Ridges, MD  Electronically Signed    SAF/MedQ  DD: 06/14/2006  DT: 06/14/2006  Job #: [id number]

## 2010-12-01 ENCOUNTER — Inpatient Hospital Stay (HOSPITAL_COMMUNITY): Payer: 59

## 2010-12-01 ENCOUNTER — Telehealth: Payer: Self-pay | Admitting: Family Medicine

## 2010-12-01 ENCOUNTER — Inpatient Hospital Stay (HOSPITAL_COMMUNITY)
Admission: AD | Admit: 2010-12-01 | Discharge: 2010-12-01 | Disposition: A | Discharge status: Home / Self Care | Payer: 59 | Source: Ambulatory Visit | Attending: Obstetrics & Gynecology | Admitting: Obstetrics & Gynecology

## 2010-12-01 DIAGNOSIS — N949 Unspecified condition associated with female genital organs and menstrual cycle: Secondary | ICD-10-CM | POA: Insufficient documentation

## 2010-12-01 LAB — CBC
HCT: 37 % (ref 36.0–46.0)
MCH: 31.6 pg (ref 26.0–34.0)
MCHC: 33.5 g/dL (ref 30.0–36.0)
RDW: 13 % (ref 11.5–15.5)

## 2010-12-01 LAB — URINE MICROSCOPIC-ADD ON

## 2010-12-01 LAB — URINALYSIS, ROUTINE W REFLEX MICROSCOPIC
Bilirubin Urine: NEGATIVE
Glucose, UA: NEGATIVE mg/dL
Ketones, ur: NEGATIVE mg/dL
pH: 6 (ref 5.0–8.0)

## 2010-12-01 LAB — WET PREP, GENITAL
Clue Cells Wet Prep HPF POC: NONE SEEN
Yeast Wet Prep HPF POC: NONE SEEN

## 2010-12-01 NOTE — Telephone Encounter (Signed)
Pt called and was having some cramping on the left side.  Starts at rib cage and goes down to abd.  She was suppose to start her menstrual period on 6/25.  She has taken several UPTs and they are negative.  Pt is also complaining of bloody discharge. No odor.  She does not have a OBGYN.  Told pt she would need to be seen. Dicussed with Dr Clent Ridges and per Dr Clent Ridges verbal orders go to [facility].  Called pt and instructed her to go to [facility].  Called [facility] and informed Garry Heater, RN that pt was coming, gave her pt symptoms, and told her to evaluate and treat.

## 2010-12-02 LAB — GC/CHLAMYDIA PROBE AMP, GENITAL
Chlamydia, DNA Probe: NEGATIVE
GC Probe Amp, Genital: NEGATIVE

## 2010-12-02 NOTE — Telephone Encounter (Signed)
Agreed -

## 2010-12-09 ENCOUNTER — Other Ambulatory Visit: Payer: Self-pay | Admitting: Obstetrics and Gynecology

## 2011-01-07 ENCOUNTER — Telehealth: Payer: Self-pay | Admitting: *Deleted

## 2011-01-07 NOTE — Telephone Encounter (Addendum)
Error

## 2011-01-07 NOTE — Telephone Encounter (Signed)
Pt is asking Dr. Clent Ridges to put her on the Mini Pill for birth control?

## 2011-01-08 MED ORDER — DROSPIRENONE-ETHINYL ESTRADIOL 3-0.02 MG PO TABS: 1.0000 | ORAL_TABLET | Freq: Every day | ORAL | Status: DC

## 2011-01-08 NOTE — Telephone Encounter (Signed)
Call in Yaz to take daily, a one year supply

## 2011-01-08 NOTE — Telephone Encounter (Signed)
Script sent e-scribe 

## 2011-01-13 ENCOUNTER — Telehealth: Payer: Self-pay | Admitting: *Deleted

## 2011-01-13 NOTE — Telephone Encounter (Signed)
Please cancel the Yaz, and instead call in Micronor 0.35 mg a day, one year supply. This contains only progesterone

## 2011-01-13 NOTE — Telephone Encounter (Signed)
Pt wanted a BCP with Progesterone only due to her asthma.  Toni Edwards is not what she wanted??

## 2011-01-14 MED ORDER — NORETHINDRONE 0.35 MG PO TABS: 1.0000 | ORAL_TABLET | Freq: Every day | ORAL | Status: DC

## 2011-01-14 NOTE — Telephone Encounter (Signed)
rx sent in to pharmacy and Yaz prescription cancelled.

## 2011-02-18 ENCOUNTER — Other Ambulatory Visit: Payer: Self-pay | Admitting: Family Medicine

## 2011-02-18 NOTE — Telephone Encounter (Signed)
Pt req refill of atomoxetine (STRATTERA) 100 MG capsule to be called in to [facility] [address]

## 2011-02-19 NOTE — Telephone Encounter (Signed)
Refill request for Strattera 100 mg. Pt last here on 10/09/10.

## 2011-02-20 MED ORDER — ATOMOXETINE HCL 100 MG PO CAPS: 100.0000 mg | ORAL_CAPSULE | Freq: Every day | ORAL | Status: DC

## 2011-02-20 NOTE — Telephone Encounter (Signed)
Call in a one year supply  

## 2011-02-20 NOTE — Telephone Encounter (Signed)
rx sent into pharmacy

## 2011-02-27 ENCOUNTER — Other Ambulatory Visit: Payer: Self-pay | Admitting: Family Medicine

## 2011-02-27 DIAGNOSIS — Z1231 Encounter for screening mammogram for malignant neoplasm of breast: Secondary | ICD-10-CM

## 2011-03-10 ENCOUNTER — Ambulatory Visit
Admission: RE | Admit: 2011-03-10 | Discharge: 2011-03-10 | Disposition: A | Discharge status: Home / Self Care | Payer: 59 | Source: Ambulatory Visit | Attending: Family Medicine | Admitting: Family Medicine

## 2011-03-10 DIAGNOSIS — Z1231 Encounter for screening mammogram for malignant neoplasm of breast: Secondary | ICD-10-CM

## 2011-05-22 ENCOUNTER — Ambulatory Visit (INDEPENDENT_AMBULATORY_CARE_PROVIDER_SITE_OTHER): Payer: 59 | Admitting: Family Medicine

## 2011-05-22 ENCOUNTER — Encounter: Payer: Self-pay | Admitting: Family Medicine

## 2011-05-22 VITALS — BP 102/68 | HR 77 | Temp 99.2°F | Wt 142.0 lb

## 2011-05-22 DIAGNOSIS — Z720 Tobacco use: Secondary | ICD-10-CM

## 2011-05-22 DIAGNOSIS — F17200 Nicotine dependence, unspecified, uncomplicated: Secondary | ICD-10-CM

## 2011-05-22 DIAGNOSIS — S4350XA Sprain of unspecified acromioclavicular joint, initial encounter: Secondary | ICD-10-CM

## 2011-05-22 MED ORDER — VARENICLINE TARTRATE 0.5 MG X 11 & 1 MG X 42 PO MISC: ORAL | Status: AC

## 2011-05-22 MED ORDER — ETODOLAC 500 MG PO TABS: 500.0000 mg | ORAL_TABLET | Freq: Two times a day (BID) | ORAL | Status: DC

## 2011-05-22 MED ORDER — VARENICLINE TARTRATE 1 MG PO TABS: 1.0000 mg | ORAL_TABLET | Freq: Two times a day (BID) | ORAL | Status: AC

## 2011-05-22 MED ORDER — CYCLOBENZAPRINE HCL 10 MG PO TABS: 10.0000 mg | ORAL_TABLET | Freq: Three times a day (TID) | ORAL | Status: DC | PRN

## 2011-05-22 NOTE — Progress Notes (Signed)
  Subjective:    Patient ID: Toni Edwards, female    DOB: 15-Jul-1975, 35 y.o.   MRN: [id number]  HPI Here for 2 things. First she has had intermittent sharp pains in the top of the right shoulder for 4 weeks. No trauma hx. No swelling. The pain does not radiate. Also she wants to try Chantix to quit smoking.    Review of Systems  Constitutional: Negative.   Musculoskeletal: Positive for arthralgias.       Objective:   Physical Exam  Constitutional: She appears well-developed and well-nourished.  Pulmonary/Chest: Effort normal and breath sounds normal.  Musculoskeletal:       Tender over the right AC joint, full ROM           Assessment & Plan:  Try Etodolac, ice packs and rest for the shoulder. Try Chantix

## 2011-07-31 ENCOUNTER — Encounter: Payer: Self-pay | Admitting: Family

## 2011-07-31 ENCOUNTER — Ambulatory Visit (INDEPENDENT_AMBULATORY_CARE_PROVIDER_SITE_OTHER): Payer: 59 | Admitting: Family

## 2011-07-31 VITALS — BP 104/60 | Temp 98.8°F | Ht 65.0 in | Wt 146.0 lb

## 2011-07-31 DIAGNOSIS — H6990 Unspecified Eustachian tube disorder, unspecified ear: Secondary | ICD-10-CM

## 2011-07-31 DIAGNOSIS — J069 Acute upper respiratory infection, unspecified: Secondary | ICD-10-CM

## 2011-07-31 DIAGNOSIS — H6980 Other specified disorders of Eustachian tube, unspecified ear: Secondary | ICD-10-CM

## 2011-07-31 DIAGNOSIS — J029 Acute pharyngitis, unspecified: Secondary | ICD-10-CM

## 2011-07-31 MED ORDER — AMOXICILLIN 500 MG PO TABS: 1000.0000 mg | ORAL_TABLET | Freq: Two times a day (BID) | ORAL | Status: AC

## 2011-07-31 NOTE — Patient Instructions (Signed)
1. Sudafed or Zyrtec-D  Upper Respiratory Infection, Adult An upper respiratory infection (URI) is also sometimes known as the common cold. The upper respiratory tract includes the nose, sinuses, throat, trachea, and bronchi. Bronchi are the airways leading to the lungs. Most people improve within 1 week, but symptoms can last up to 2 weeks. A residual cough may last even longer.  CAUSES Many different viruses can infect the tissues lining the upper respiratory tract. The tissues become irritated and inflamed and often become very moist. Mucus production is also common. A cold is contagious. You can easily spread the virus to others by oral contact. This includes kissing, sharing a glass, coughing, or sneezing. Touching your mouth or nose and then touching a surface, which is then touched by another person, can also spread the virus. SYMPTOMS  Symptoms typically develop 1 to 3 days after you come in contact with a cold virus. Symptoms vary from person to person. They may include:  Runny nose.   Sneezing.   Nasal congestion.   Sinus irritation.   Sore throat.   Loss of voice (laryngitis).   Cough.   Fatigue.   Muscle aches.   Loss of appetite.   Headache.   Low-grade fever.  DIAGNOSIS  You might diagnose your own cold based on familiar symptoms, since most people get a cold 2 to 3 times a year. Your caregiver can confirm this based on your exam. Most importantly, your caregiver can check that your symptoms are not due to another disease such as strep throat, sinusitis, pneumonia, asthma, or epiglottitis. Blood tests, throat tests, and X-rays are not necessary to diagnose a common cold, but they may sometimes be helpful in excluding other more serious diseases. Your caregiver will decide if any further tests are required. RISKS AND COMPLICATIONS  You may be at risk for a more severe case of the common cold if you smoke cigarettes, have chronic heart disease (such as heart failure) or  lung disease (such as asthma), or if you have a weakened immune system. The very young and very old are also at risk for more serious infections. Bacterial sinusitis, middle ear infections, and bacterial pneumonia can complicate the common cold. The common cold can worsen asthma and chronic obstructive pulmonary disease (COPD). Sometimes, these complications can require emergency medical care and may be life-threatening. PREVENTION  The best way to protect against getting a cold is to practice good hygiene. Avoid oral or hand contact with people with cold symptoms. Wash your hands often if contact occurs. There is no clear evidence that vitamin C, vitamin E, echinacea, or exercise reduces the chance of developing a cold. However, it is always recommended to get plenty of rest and practice good nutrition. TREATMENT  Treatment is directed at relieving symptoms. There is no cure. Antibiotics are not effective, because the infection is caused by a virus, not by bacteria. Treatment may include:  Increased fluid intake. Sports drinks offer valuable electrolytes, sugars, and fluids.   Breathing heated mist or steam (vaporizer or shower).   Eating chicken soup or other clear broths, and maintaining good nutrition.   Getting plenty of rest.   Using gargles or lozenges for comfort.   Controlling fevers with ibuprofen or acetaminophen as directed by your caregiver.   Increasing usage of your inhaler if you have asthma.  Zinc gel and zinc lozenges, taken in the first 24 hours of the common cold, can shorten the duration and lessen the severity of symptoms. Pain medicines  may help with fever, muscle aches, and throat pain. A variety of non-prescription medicines are available to treat congestion and runny nose. Your caregiver can make recommendations and may suggest nasal or lung inhalers for other symptoms.  HOME CARE INSTRUCTIONS   Only take over-the-counter or prescription medicines for pain, discomfort,  or fever as directed by your caregiver.   Use a warm mist humidifier or inhale steam from a shower to increase air moisture. This may keep secretions moist and make it easier to breathe.   Drink enough water and fluids to keep your urine clear or pale yellow.   Rest as needed.   Return to work when your temperature has returned to normal or as your caregiver advises. You may need to stay home longer to avoid infecting others. You can also use a face mask and careful hand washing to prevent spread of the virus.  SEEK MEDICAL CARE IF:   After the first few days, you feel you are getting worse rather than better.   You need your caregiver's advice about medicines to control symptoms.   You develop chills, worsening shortness of breath, or brown or red sputum. These may be signs of pneumonia.   You develop yellow or brown nasal discharge or pain in the face, especially when you bend forward. These may be signs of sinusitis.   You develop a fever, swollen neck glands, pain with swallowing, or white areas in the back of your throat. These may be signs of strep throat.  SEEK IMMEDIATE MEDICAL CARE IF:   You have a fever.   You develop severe or persistent headache, ear pain, sinus pain, or chest pain.   You develop wheezing, a prolonged cough, cough up blood, or have a change in your usual mucus (if you have chronic lung disease).   You develop sore muscles or a stiff neck.  Document Released: 11/11/2000 Document Revised: 01/28/2011 Document Reviewed: 09/19/2010 [facility] Patient Information 2012 [place], [place].

## 2011-07-31 NOTE — Progress Notes (Signed)
Subjective:    Patient ID: Toni Edwards, female    DOB: Jan 06, 1976, 36 y.o.   MRN: [id number]  HPI 36 year old white female, nonsmoker, patient of Dr. Clent Ridges this a.m. with complaints of fever, right ear pain, cough, sneezing and congestion this hormone for one week. She's been taking over-the-counter NyQuil, DayQuil, Tylenol cold and sinus with no relief. She denies any lightheadedness, dizziness, chest pain, palpitations, shortness of breath or edema.   Review of Systems  Constitutional: Positive for fever and fatigue.  HENT: Positive for ear pain, congestion, sore throat, rhinorrhea, sneezing and postnasal drip.   Respiratory: Positive for cough.   Cardiovascular: Negative.   Gastrointestinal: Negative.   Musculoskeletal: Negative.   Skin: Negative.   Neurological: Negative.   Hematological: Negative.   Psychiatric/Behavioral: Negative.        Past Medical History  Diagnosis Date  . Fracture of lumbar vertebra 1995  . Scoliosis   . Breast nodule     by exam and [place] in July 2010 seen by Dr Dwain Sarna, resolved on its own  . ADHD (attention deficit hyperactivity disorder)     History   Social History  . Marital Status: Single    Spouse Name: N/A    Number of Children: N/A  . Years of Education: N/A   Occupational History  . Not on file.   Social History Main Topics  . Smoking status: Former Smoker -- 0.5 packs/day    Types: Cigarettes    Quit date: 06/02/2011  . Smokeless tobacco: Never Used  . Alcohol Use: Yes     once a month  . Drug Use: No  . Sexually Active: Not on file   Other Topics Concern  . Not on file   Social History Narrative  . No narrative on file    No past surgical history on file.  Family History  Problem Relation Age of Onset  . Alcohol abuse    . Breast cancer    . Hyperlipidemia      No Known Allergies  Current Outpatient Prescriptions on File Prior to Visit  Medication Sig Dispense Refill  . cyclobenzaprine (FLEXERIL) 10 MG  tablet Take 1 tablet (10 mg total) by mouth every 8 (eight) hours as needed for muscle spasms.  90 tablet  11  . etodolac (LODINE) 500 MG tablet Take 1 tablet (500 mg total) by mouth 2 (two) times daily.  60 tablet  11  . norethindrone (MICRONOR,CAMILA,ERRIN) 0.35 MG tablet Take 1 tablet (0.35 mg total) by mouth daily.  1 Package  11  . varenicline (CHANTIX CONTINUING MONTH PAK) 1 MG tablet Take 1 tablet (1 mg total) by mouth 2 (two) times daily.  1 tablet  1  . varenicline (CHANTIX STARTING MONTH PAK) 0.5 MG X 11 & 1 MG X 42 tablet Take one 0.5mg  tablet by mouth once daily for 3 days, then increase to one 0.5mg  tablet twice daily for 3 days, then increase to one 1mg  tablet twice daily.  53 tablet  0    BP 104/60  Temp(Src) 98.8 F (37.1 C) (Oral)  Ht 5\' 5"  (1.651 m)  Wt 146 lb (66.225 kg)  BMI 24.30 kg/m2chart Objective:   Physical Exam  Constitutional: She appears well-developed and well-nourished.  HENT:  Right Ear: External ear normal.  Left Ear: External ear normal.  Nose: Nose normal.       Pharynx is moderately red.  Neck: Normal range of motion. Neck supple.       Anterior cervical  lymphadenopathy noted  Cardiovascular: Normal rate, regular rhythm and normal heart sounds.   Pulmonary/Chest: Effort normal and breath sounds normal.  Abdominal: Soft. Bowel sounds are normal.  Musculoskeletal: Normal range of motion.  Lymphadenopathy:    She has cervical adenopathy.  Neurological: She is alert.  Skin: Skin is warm and dry.  Psychiatric: She has a normal mood and affect.          Assessment & Plan:  Assessment: Otalgia, eustachian tube dysfunction, upper respiratory infection, pharyngitis  Plan: Amoxicillin 500 mg 2 capsules by mouth twice a day x10 days. Continue over-the-counter symptomatic treatment for relief. Advise Sudafed or Zyrtec-D. Patient to call the office if symptoms worsen or persist, recheck as scheduled or when necessary.

## 2011-12-19 ENCOUNTER — Other Ambulatory Visit: Payer: Self-pay | Admitting: Family Medicine

## 2012-01-12 ENCOUNTER — Telehealth: Payer: Self-pay | Admitting: Family Medicine

## 2012-01-12 NOTE — Telephone Encounter (Signed)
Pt states that she started varenicline (CHANTIX CONTINUING MONTH PAK) 1 MG tablet December 2012 pt states that she quit smoking but has recently started again and i requesting a new RX    [facility] [address]

## 2012-01-12 NOTE — Telephone Encounter (Signed)
Call in one Starting Pack and 2 Continuing Packs of Chantix

## 2012-01-12 NOTE — Telephone Encounter (Signed)
Please advise  Last seen by you 05/2011

## 2012-01-13 MED ORDER — VARENICLINE TARTRATE 1 MG PO TABS: 1.0000 mg | ORAL_TABLET | Freq: Two times a day (BID) | ORAL | Status: AC

## 2012-01-13 MED ORDER — VARENICLINE TARTRATE 0.5 MG X 11 & 1 MG X 42 PO MISC: ORAL | Status: AC

## 2012-01-13 NOTE — Telephone Encounter (Signed)
done

## 2012-06-01 DIAGNOSIS — B009 Herpesviral infection, unspecified: Secondary | ICD-10-CM

## 2012-06-01 HISTORY — DX: Herpesviral infection, unspecified: B00.9

## 2012-06-07 ENCOUNTER — Other Ambulatory Visit: Payer: Self-pay | Admitting: Family Medicine

## 2012-07-05 IMAGING — US US TRANSVAGINAL NON-OB
1 series · 14 of 25 positions shown · non-contrast
Comparison: None.

CLINICAL DATA: Left pelvic pain

TRANSABDOMINAL AND TRANSVAGINAL ULTRASOUND OF PELVIS
TECHNIQUE: Both transabdominal and transvaginal ultrasound
examinations of the pelvis were performed. Transabdominal technique
was performed for global imaging of the pelvis including uterus,
ovaries, adnexal regions, and pelvic cul-de-sac.

[Series 1: us pelvis complete · 14 of 81 slices shown]
[im 1/81]
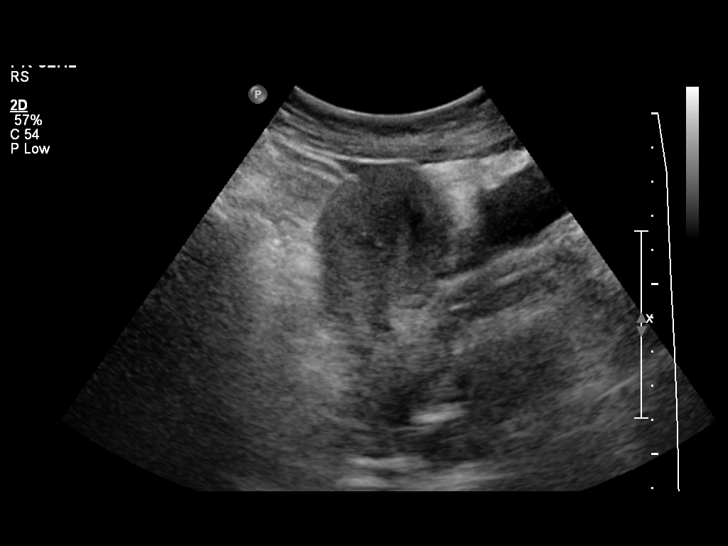
[im 7/81]
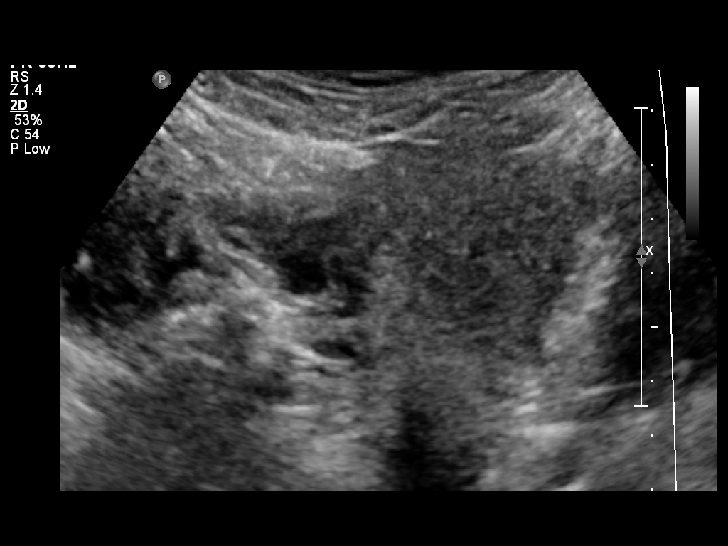
[im 14/81]
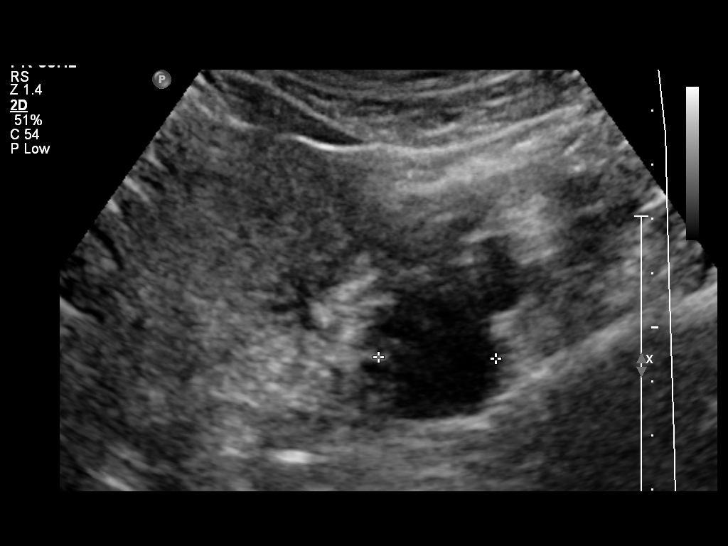
[im 21/81]
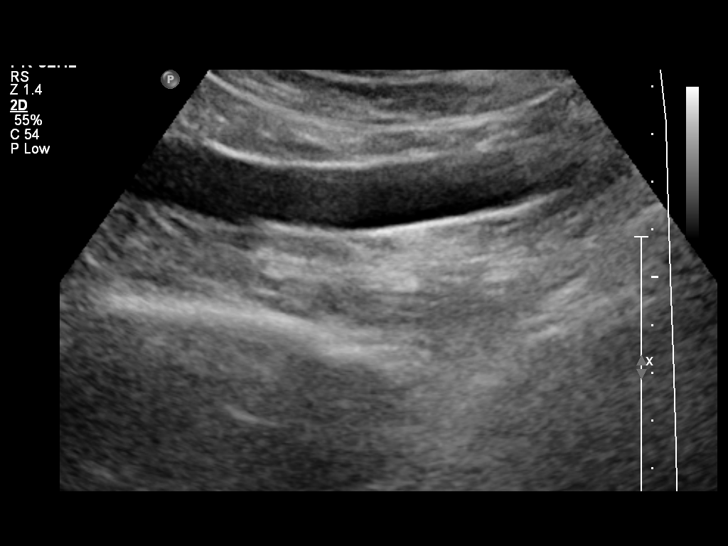
[im 27/81]
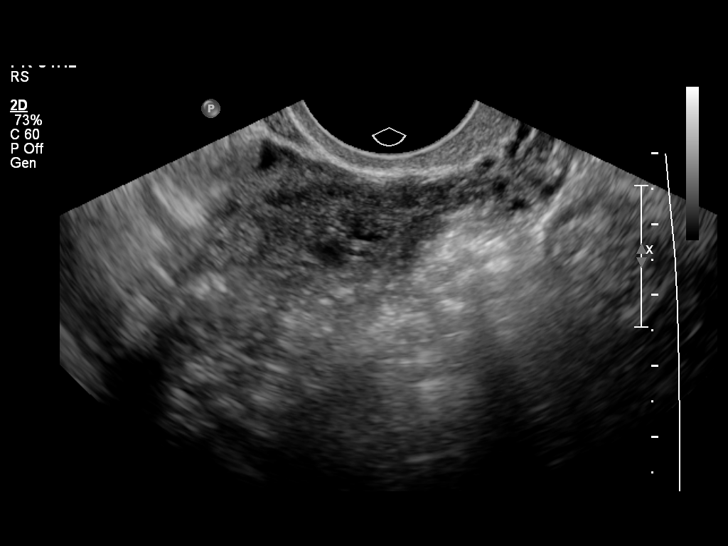
[im 31/81]
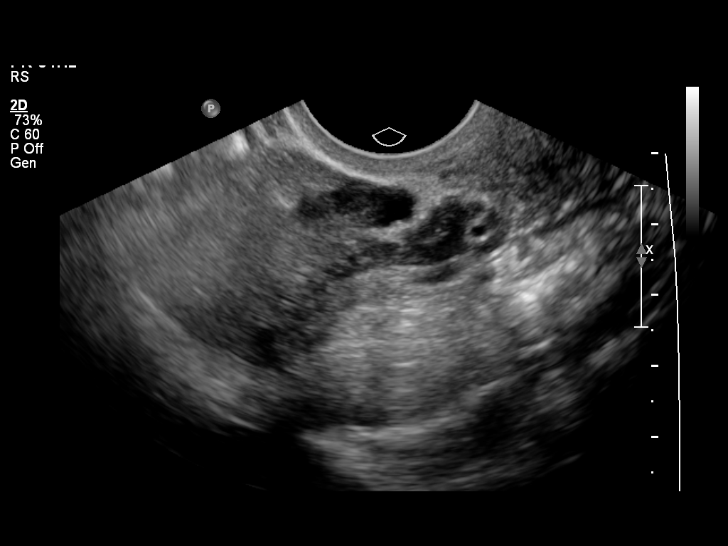
[im 37/81]
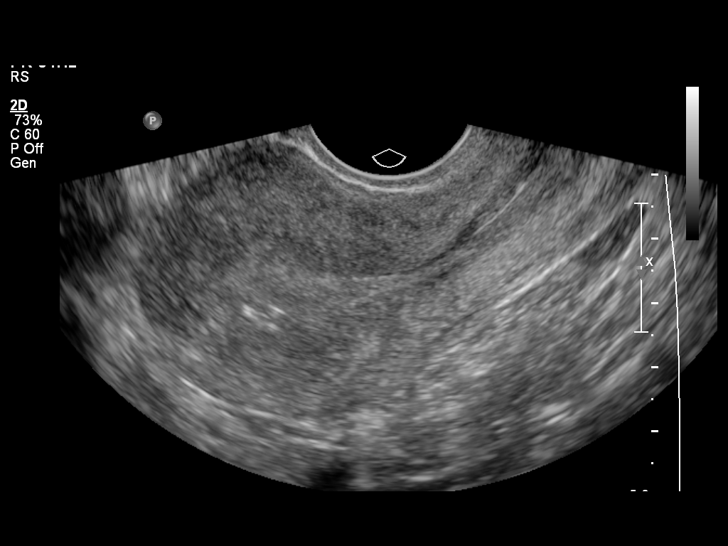
[im 44/81]
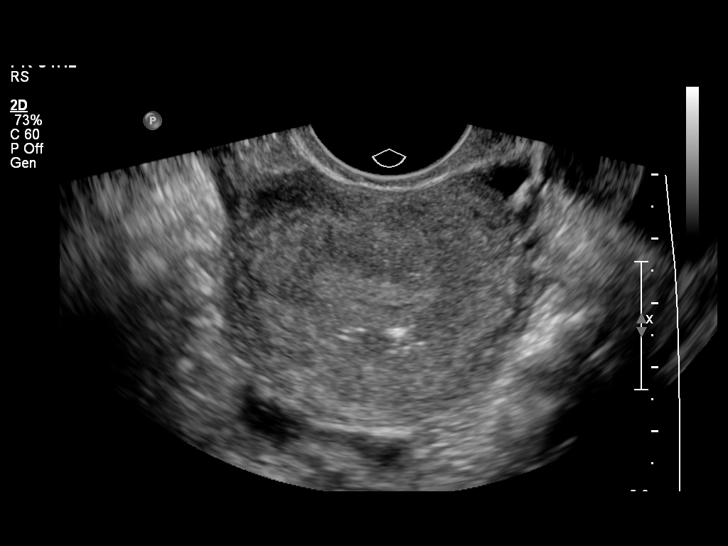
[im 51/81]
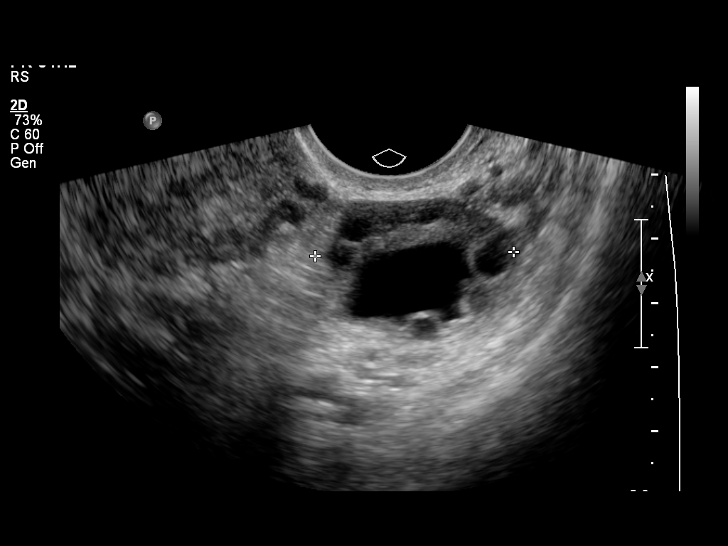
[im 54/81]
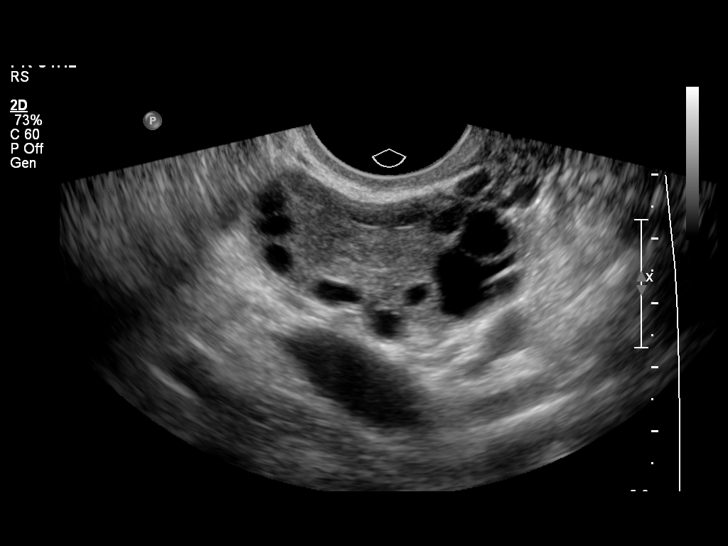
[im 61/81]
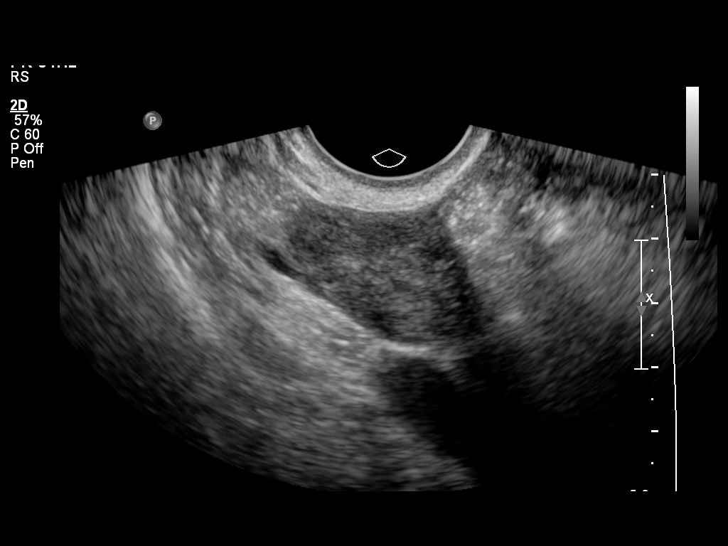
[im 67/81]
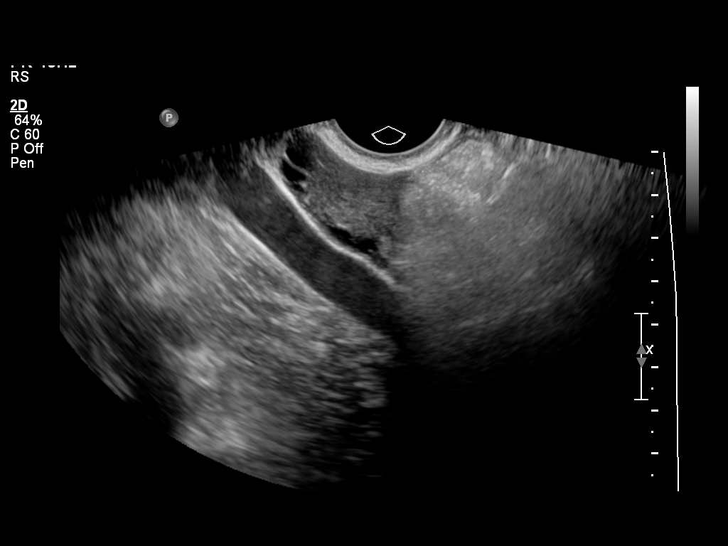
[im 74/81]
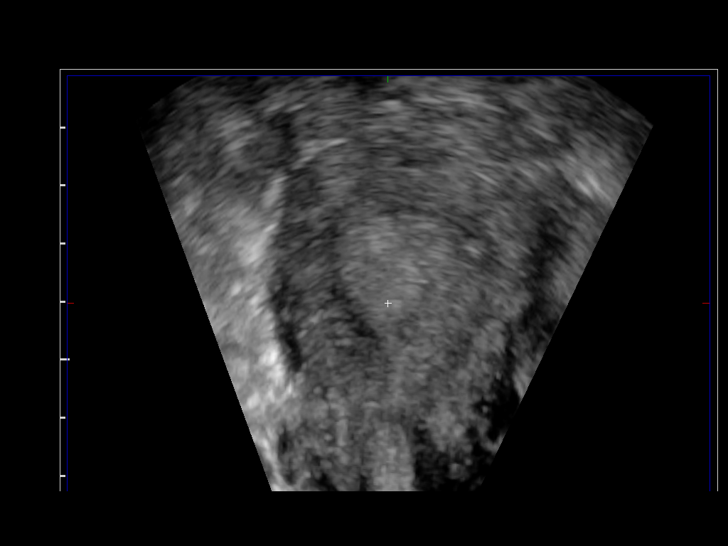
[im 81/81]
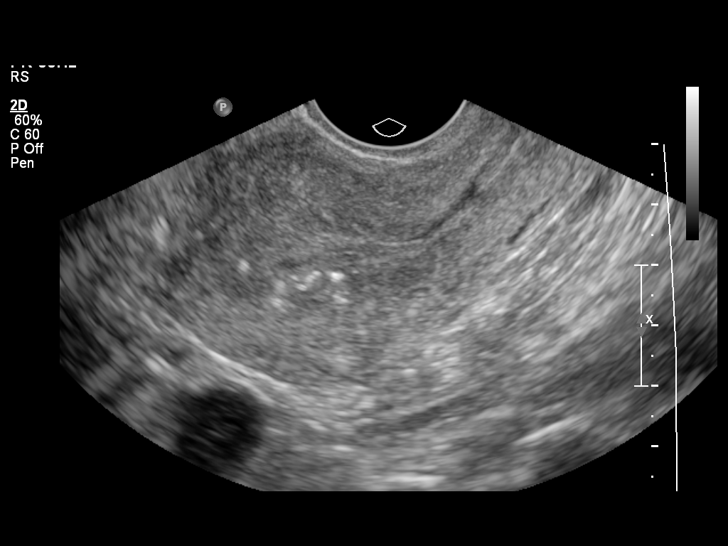

[14 of 25 positions shown; findings below may reference images not displayed]

It was necessary to proceed with endovaginal exam following the
transabdomnial exam to visualize the bilateral ovaries.
FINDINGS: Uterus: Normal in size and appearance, measuring 8.5 x 4.5 x
cm.

Endometrium: Notable for a dystrophic calcifications.  Measures 7
mm in thickness.

Right ovary:  Normal appearance/no adnexal mass.  Measures 4.1 x
2.1 x 2.5 cm.

Left ovary: Normal appearance/no adnexal mass.  Measures 4.3 x
x 3.1 cm.

Other findings: No free fluid
IMPRESSION: Normal sonographic appearance of the uterus and bilateral ovaries.

## 2012-08-02 ENCOUNTER — Other Ambulatory Visit: Payer: Self-pay | Admitting: Family Medicine

## 2012-08-02 NOTE — Telephone Encounter (Signed)
Can we refill this? 

## 2012-08-06 ENCOUNTER — Other Ambulatory Visit: Payer: Self-pay | Admitting: Family Medicine

## 2012-08-06 NOTE — Telephone Encounter (Signed)
Pt last seen Dr. Clent Ridges 05/22/11 and saw Orvan Falconer on 07/31/11.  Both rx have been called in to pharmacy.

## 2012-09-19 ENCOUNTER — Encounter: Payer: Self-pay | Admitting: *Deleted

## 2012-11-07 ENCOUNTER — Ambulatory Visit (INDEPENDENT_AMBULATORY_CARE_PROVIDER_SITE_OTHER): Payer: 59 | Admitting: Gynecology

## 2012-11-07 ENCOUNTER — Encounter: Payer: Self-pay | Admitting: Gynecology

## 2012-11-07 ENCOUNTER — Ambulatory Visit: Payer: Self-pay | Admitting: Obstetrics and Gynecology

## 2012-11-07 VITALS — BP 118/62 | HR 66 | Resp 18 | Ht 65.0 in | Wt 130.0 lb

## 2012-11-07 DIAGNOSIS — F17200 Nicotine dependence, unspecified, uncomplicated: Secondary | ICD-10-CM

## 2012-11-07 DIAGNOSIS — Z720 Tobacco use: Secondary | ICD-10-CM

## 2012-11-07 DIAGNOSIS — Z01419 Encounter for gynecological examination (general) (routine) without abnormal findings: Secondary | ICD-10-CM

## 2012-11-07 DIAGNOSIS — Z309 Encounter for contraceptive management, unspecified: Secondary | ICD-10-CM

## 2012-11-07 DIAGNOSIS — Z124 Encounter for screening for malignant neoplasm of cervix: Secondary | ICD-10-CM

## 2012-11-07 DIAGNOSIS — Z8619 Personal history of other infectious and parasitic diseases: Secondary | ICD-10-CM

## 2012-11-07 DIAGNOSIS — Z0000 Encounter for general adult medical examination without abnormal findings: Secondary | ICD-10-CM

## 2012-11-07 LAB — POCT URINALYSIS DIPSTICK: Urobilinogen, UA: NEGATIVE

## 2012-11-07 MED ORDER — NORETHINDRONE 0.35 MG PO TABS: 1.0000 | ORAL_TABLET | Freq: Every day | ORAL | Status: DC

## 2012-11-07 MED ORDER — NORETHIN-ETH ESTRAD-FE BIPHAS 1 MG-10 MCG / 10 MCG PO TABS: 1.0000 | ORAL_TABLET | Freq: Every day | ORAL | Status: DC

## 2012-11-07 NOTE — Progress Notes (Signed)
37 y.o. single Caucasian female   No obstetric history on file. here for annual exam. Pt is currently sexually active.  Current partner 1y, no condoms.  Pt is currently on micronor due to melasma.  Pt interested in moving cycle back 1 week due to custody issues. Pt seen often for genital warts, feels better in that regard Pt trying to quit tobacco using vaporized cigs  Patient's last menstrual period was 10/22/2012.          Sexually active: yes  The current method of family planning is OCP (estrogen/progesterone).    Exercising: no  not currently Last pap: 06/08/2011 +HRHPV Alcohol: 8-10 drinks/wk Tobacco: 1 pack/qd BSE: no  Hgb: 13.5 - Urine: Leuks Trace    Health Maintenance  Topic Date Due  . Tetanus/tdap  03/27/1997  . Influenza Vaccine  01/30/2013  . Pap Smear  10/08/2013    Family History  Problem Relation Age of Onset  . Alcohol abuse    . Breast cancer    . Hyperlipidemia    . Breast cancer Mother 3  . Breast cancer Maternal Grandmother 20    Patient Active Problem List   Diagnosis Date Noted  . ANXIETY STATE, UNSPECIFIED 03/05/2010  . ADHD 10/10/2009  . LOW BACK PAIN, CHRONIC 04/29/2009  . SCOLIOSIS 04/29/2009  . BREAST MASS 10/30/2008  . HIP STRAIN, RIGHT 08/15/2008  . NUMBNESS 03/01/2008  . PHARYNGITIS 06/24/2007    Past Medical History  Diagnosis Date  . Fracture of lumbar vertebra 1995  . Scoliosis   . Breast nodule     by exam and [place] in July 2010 seen by Dr Dwain Sarna, resolved on its own  . ADHD (attention deficit hyperactivity disorder)   . HSV-2 infection 1/14    No past surgical history on file.  Allergies: Review of patient's allergies indicates no known allergies.  Current Outpatient Prescriptions  Medication Sig Dispense Refill  . cyclobenzaprine (FLEXERIL) 10 MG tablet TAKE 1 TABLET (10 MG TOTAL) BY MOUTH EVERY 8 (EIGHT) HOURS AS NEEDED FOR MUSCLE SPASMS.  90 tablet  3  . etodolac (LODINE) 500 MG tablet TAKE 1 TABLET (500 MG TOTAL)  BY MOUTH 2 (TWO) TIMES DAILY.  60 tablet  1  . norethindrone (MICRONOR,CAMILA,ERRIN) 0.35 MG tablet Take 1 tablet (0.35 mg total) by mouth daily.  1 Package  11  . valACYclovir (VALTREX) 500 MG tablet        No current facility-administered medications for this visit.    ROS: Pertinent items are noted in HPI.  Exam:    BP 118/62  Pulse 66  Resp 18  Ht 5\' 5"  (1.651 m)  Wt 130 lb (58.968 kg)  BMI 21.63 kg/m2  LMP 10/22/2012 Weight change: @WEIGHTCHANGE @ Last 3 height recordings:  Ht Readings from Last 3 Encounters:  11/07/12 5\' 5"  (1.651 m)  07/31/11 5\' 5"  (1.651 m)  10/09/10 5\' 5"  (1.651 m)   General appearance: alert, cooperative and appears stated age Head: Normocephalic, without obvious abnormality, atraumatic Neck: no adenopathy, no carotid bruit, no JVD, supple, symmetrical, trachea midline and thyroid not enlarged, symmetric, no tenderness/mass/nodules Lungs: clear to auscultation bilaterally Breasts: normal appearance, no masses or tenderness Heart: regular rate and rhythm, S1, S2 normal, no murmur, click, rub or gallop Abdomen: soft, non-tender; bowel sounds normal; no masses,  no organomegaly Extremities: extremities normal, atraumatic, no cyanosis or edema Skin: Skin color, texture, turgor normal. No rashes or lesions Lymph nodes: Cervical, supraclavicular, and axillary nodes normal. no inguinal nodes palpated Neurologic:  Grossly normal   Pelvic: External genitalia:  no lesions              Urethra: normal appearing urethra with no masses, tenderness or lesions              Bartholins and Skenes: normal                 Vagina: normal appearing vagina with normal color and discharge, no lesions              Cervix: normal appearance              Pap taken: yes        Bimanual Exam:  Uterus:  uterus is normal size, shape, consistency and nontender                                      Adnexa:    normal adnexa in size, nontender and no masses                                       Rectovaginal: Confirms                                      Anus:  normal sphincter tone, no lesions  A: well woman Contraceptive management Family history of breast cancer-mother at 47 smoker     P: BRCA negative, mammograms now, BSE stressed pap smear with HRHPV Pt will try to move menses with single pack of LoLoestrin then resume micronor Quit tobacco return annually or prn   An After Visit Summary was printed and given to the patient.

## 2012-11-10 LAB — IPS PAP TEST WITH HPV

## 2012-11-11 ENCOUNTER — Telehealth: Payer: Self-pay | Admitting: *Deleted

## 2012-11-11 NOTE — Telephone Encounter (Signed)
Left Message To Call Back  

## 2012-11-11 NOTE — Telephone Encounter (Signed)
Message copied by Lorraine Lax on Fri Nov 11, 2012  2:45 PM ------      Message from: Douglass Rivers      Created: Thu Nov 10, 2012  6:46 PM       Normal, neg HRHPV!, BV noted-treat if symptomatic ------

## 2012-11-14 NOTE — Telephone Encounter (Signed)
Left Message To Call Back  

## 2012-11-14 NOTE — Telephone Encounter (Signed)
Pt notified of labs (see papsmear)

## 2012-11-14 NOTE — Telephone Encounter (Signed)
Patient returning Jasmine's call.  

## 2012-11-15 ENCOUNTER — Telehealth: Payer: Self-pay | Admitting: *Deleted

## 2012-11-15 MED ORDER — METRONIDAZOLE 500 MG PO TABS: 500.0000 mg | ORAL_TABLET | Freq: Two times a day (BID) | ORAL | Status: DC

## 2012-11-15 NOTE — Telephone Encounter (Signed)
Pt had bacterial vaginosis on papsmear per Dr. Farrel Gobble okay to call in (see lab papsmear)

## 2012-11-19 ENCOUNTER — Other Ambulatory Visit: Payer: Self-pay | Admitting: Obstetrics and Gynecology

## 2012-12-08 ENCOUNTER — Other Ambulatory Visit: Payer: Self-pay | Admitting: Family Medicine

## 2012-12-16 ENCOUNTER — Ambulatory Visit (INDEPENDENT_AMBULATORY_CARE_PROVIDER_SITE_OTHER): Payer: 59 | Admitting: Family Medicine

## 2012-12-16 ENCOUNTER — Encounter: Payer: Self-pay | Admitting: Family Medicine

## 2012-12-16 VITALS — BP 108/68 | HR 98 | Temp 98.7°F | Wt 132.0 lb

## 2012-12-16 DIAGNOSIS — M545 Low back pain: Secondary | ICD-10-CM

## 2012-12-16 NOTE — Progress Notes (Signed)
  Subjective:    Patient ID: Toni Edwards, female    DOB: 04-Jul-1975, 37 y.o.   MRN: [id number]  HPI Here for worsening stiffness and pain in the right lower back. She had a lumbar fracture in 1995 but this resolved a long time ago. She has some scoliosis and takes Etodolac occasionally. This helps the pain but she does not use it regularly. No pain in the legs.    Review of Systems  Constitutional: Negative.   Musculoskeletal: Positive for back pain.       Objective:   Physical Exam  Constitutional: She appears well-developed and well-nourished. No distress.  Musculoskeletal:  She has some mild scoliosis. There is no tenderness or spasm in the back. Full ROM           Assessment & Plan:  She has some functional pain in the lower back. I recommended she take yoga classes several days a week to improve the strength and flexibility of her core. Take Etodolac twice daily for awhile.

## 2013-01-31 ENCOUNTER — Other Ambulatory Visit: Payer: Self-pay | Admitting: Obstetrics and Gynecology

## 2013-01-31 NOTE — Telephone Encounter (Signed)
11/07/12 #1 packs with 11 rf's was sent through to pharmacy.

## 2013-02-03 ENCOUNTER — Other Ambulatory Visit: Payer: Self-pay | Admitting: Obstetrics and Gynecology

## 2013-02-23 ENCOUNTER — Other Ambulatory Visit: Payer: Self-pay | Admitting: Family Medicine

## 2013-03-14 ENCOUNTER — Encounter: Payer: Self-pay | Admitting: Gynecology

## 2013-05-28 ENCOUNTER — Other Ambulatory Visit: Payer: Self-pay | Admitting: Family Medicine

## 2013-06-14 ENCOUNTER — Other Ambulatory Visit: Payer: Self-pay | Admitting: Certified Nurse Midwife

## 2013-06-14 NOTE — Telephone Encounter (Signed)
eScribe request from [facility] for refill on VALTREX Last filled - 06/06/12, #30 X 11 Last AEX - 11/07/12 Next AEX - 11/03/13 Please advise refills.  Chart on your desk.

## 2013-07-03 ENCOUNTER — Emergency Department (HOSPITAL_COMMUNITY)
Admission: EM | Admit: 2013-07-03 | Discharge: 2013-07-03 | Disposition: A | Discharge status: Home / Self Care | Payer: 59 | Source: Home / Self Care | Attending: Family Medicine | Admitting: Family Medicine

## 2013-07-03 ENCOUNTER — Encounter (HOSPITAL_COMMUNITY): Payer: Self-pay | Admitting: Emergency Medicine

## 2013-07-03 DIAGNOSIS — M5432 Sciatica, left side: Secondary | ICD-10-CM

## 2013-07-03 DIAGNOSIS — M5430 Sciatica, unspecified side: Secondary | ICD-10-CM

## 2013-07-03 LAB — POCT URINALYSIS DIP (DEVICE)
Glucose, UA: NEGATIVE mg/dL
Hgb urine dipstick: NEGATIVE
Ketones, ur: NEGATIVE mg/dL
LEUKOCYTES UA: NEGATIVE
Nitrite: NEGATIVE
PROTEIN: NEGATIVE mg/dL
Specific Gravity, Urine: 1.01 (ref 1.005–1.030)
Urobilinogen, UA: 0.2 mg/dL (ref 0.0–1.0)
pH: 7 (ref 5.0–8.0)

## 2013-07-03 LAB — POCT PREGNANCY, URINE: Preg Test, Ur: NEGATIVE

## 2013-07-03 MED ORDER — METHYLPREDNISOLONE 4 MG PO KIT: PACK | ORAL | Status: DC

## 2013-07-03 NOTE — ED Notes (Signed)
Pain in low back since bent over while in grocery store ; no new injury. No relief w usual rx, rest

## 2013-07-03 NOTE — ED Provider Notes (Signed)
CSN: [id number]     Arrival date & time 07/03/13  1054 History   First MD Initiated Contact with Patient 07/03/13 1113     No chief complaint on file.  (Consider location/radiation/quality/duration/timing/severity/associated sxs/prior Treatment) HPI Comments: Patient reports that she was in a store on 06/30/2013 and bent/squatted down and developed pain in left lower back. Denies changes in strength or sensation of LLE or saddle anesthesia. No bowel or bladder incontinence. Has tried using etodolac and flexeril at home with limited relief. Denies fever, chills, urinary symptoms. States symptoms increase with standing or trying to turn over in bed.   The history is provided by the patient.    Past Medical History  Diagnosis Date  . Fracture of lumbar vertebra 1995  . Scoliosis   . Breast nodule     by exam and [place] in July 2010 seen by Dr Donne Hazel, resolved on its own  . ADHD (attention deficit hyperactivity disorder)   . 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 Genital condyloma, female    No past surgical history on file. Family History  Problem Relation Age of Onset  . Alcohol abuse    . Breast cancer    . Hyperlipidemia    . Breast cancer Mother 24  . Breast cancer Maternal Grandmother 20   History  Substance Use Topics  . Smoking status: Current Every Day Smoker -- 1.00 packs/day    Types: Cigarettes  . Smokeless tobacco: Never Used     Comment: started back smoking  . Alcohol Use: 0.5 oz/week    1 drink(s) per week   OB History   Grav Para Term Preterm Abortions TAB SAB Ect Mult Living                 Review of Systems  All other systems reviewed and are negative.    Allergies  Review of patient's allergies indicates no known allergies.  Home Medications   Current Outpatient Rx  Name  Route  Sig  Dispense  Refill  . cyclobenzaprine (FLEXERIL) 10 MG tablet      TAKE 1 TABLET BY MOUTH EVERY 8 HOURS AS NEEDED FOR MUSCLE SPASMS   90 tablet   2   . etodolac (LODINE) 500 MG  tablet      TAKE 1 TABLET (500 MG TOTAL) BY MOUTH 2 (TWO) TIMES DAILY.   60 tablet   1   . etodolac (LODINE) 500 MG tablet      TAKE 1 TABLET TWICE A DAY   60 tablet   2   . norethindrone (MICRONOR,CAMILA,ERRIN) 0.35 MG tablet   Oral   Take 1 tablet (0.35 mg total) by mouth daily.   1 Package   11   . valACYclovir (VALTREX) 500 MG tablet      TAKE 1 TABLET BY MOUTH DAILY, INCREASE TO 2 A DAY FOR 3 DAYS AT ONSET OF OUT BREAK   34 tablet   10    There were no vitals taken for this visit. Physical Exam  Nursing note and vitals reviewed. Constitutional: She is oriented to person, place, and time. She appears well-developed and well-nourished. No distress.  HENT:  Head: Normocephalic and atraumatic.  Eyes: Conjunctivae are normal. No scleral icterus.  Neck: Normal range of motion. Neck supple.  Cardiovascular: Normal rate, regular rhythm and normal heart sounds.   Pulmonary/Chest: Effort normal and breath sounds normal. No respiratory distress. She has no wheezes.  Abdominal: There is no tenderness. There is no CVA tenderness.  Musculoskeletal: Normal range of motion.       Back:  +SLR on left  Neurological: She is alert and oriented to person, place, and time. She has normal strength. No sensory deficit. Coordination and gait normal.  Reflex Scores:      Patellar reflexes are 2+ on the left side. Normal great toe dorsiflexion on left  Skin: Skin is warm and dry. No rash noted.  Psychiatric: She has a normal mood and affect. Her behavior is normal.    ED Course  Procedures (including critical care time) Labs Review Labs Reviewed - No data to display Imaging Review No results found.    MDM  Exam suggestive of left sided sciatica. Will discontinue etodolac (temporarily) and treat with medrol dose pack and advise follow up with PCP if no improvement. Exam without evidence cauda equina syndrome. May continue to use flexeril as directed.    [place],  [place] 07/03/13 [phone]

## 2013-07-03 NOTE — Discharge Instructions (Signed)
You may continue to take your flexeril as needed but do not take lodine (etodolac) while using medication prescribed here today. If use of new medication does not begin to bring improvement over the next several days, please follow up with your doctor.

## 2013-07-06 NOTE — ED Provider Notes (Signed)
Medical screening examination/treatment/procedure(s) were performed by a resident physician or non-physician practitioner and as the supervising physician I was immediately available for consultation/collaboration.  Evan Corey, MD    Evan S Corey, MD 07/06/13 0746 

## 2013-09-11 ENCOUNTER — Encounter: Payer: Self-pay | Admitting: Gynecology

## 2013-09-11 ENCOUNTER — Ambulatory Visit (INDEPENDENT_AMBULATORY_CARE_PROVIDER_SITE_OTHER): Payer: 59 | Admitting: Gynecology

## 2013-09-11 ENCOUNTER — Telehealth: Payer: Self-pay | Admitting: Gynecology

## 2013-09-11 VITALS — BP 108/68 | HR 72 | Temp 98.8°F | Resp 16 | Ht 65.0 in | Wt 145.0 lb

## 2013-09-11 DIAGNOSIS — R109 Unspecified abdominal pain: Secondary | ICD-10-CM

## 2013-09-11 LAB — POCT URINALYSIS DIPSTICK
BILIRUBIN UA: NEGATIVE
GLUCOSE UA: NEGATIVE
Ketones, UA: NEGATIVE
Nitrite, UA: NEGATIVE
Protein, UA: NEGATIVE
Urobilinogen, UA: NEGATIVE
pH, UA: 5

## 2013-09-11 LAB — CBC
HEMATOCRIT: 40.6 % (ref 36.0–46.0)
Hemoglobin: 13.5 g/dL (ref 12.0–15.0)
MCH: 31.6 pg (ref 26.0–34.0)
MCHC: 33.3 g/dL (ref 30.0–36.0)
MCV: 95.1 fL (ref 78.0–100.0)
Platelets: 306 10*3/uL (ref 150–400)
RBC: 4.27 MIL/uL (ref 3.87–5.11)
RDW: 13.9 % (ref 11.5–15.5)
WBC: 12.2 10*3/uL — ABNORMAL HIGH (ref 4.0–10.5)

## 2013-09-11 LAB — POCT URINE PREGNANCY: Preg Test, Ur: NEGATIVE

## 2013-09-11 NOTE — Progress Notes (Signed)
Subjective:     Patient ID: Toni Edwards, female   DOB: [birth year], 38 y.o.   MRN: [id number]  HPI Comments: Pt here with 4w lef lower quadrant pain that began before her last menses.  Pt states that it did not get better with menses, she is taking micronor and having regular cycles.  Pt treated for constipation without relief.  Pt says pain gotten suddenly worse last night and she took a UPT which was ngative.  Pt has missed an occaisional pill but not before onset of pain.  Pt had been seen in ER in 07/2013 and diagnosed with sciatica-different pain. Pain dull and throbbing until last night, sharp stabbing-a little better this am but still feels sore.  Pt denies fever or chills.    Review of Systems  Constitutional: Negative for fever and chills.  Gastrointestinal: Negative for nausea, vomiting, diarrhea and constipation.  Genitourinary: Negative for dysuria, vaginal discharge and dyspareunia (limited coitus due to pain).   Per HPI    Objective:   Physical Exam  Nursing note and vitals reviewed. Constitutional: She is oriented to person, place, and time. She appears well-developed and well-nourished.  Abdominal: Soft. Bowel sounds are normal. She exhibits no distension. There is no tenderness. There is no rebound, no guarding and no CVA tenderness.  Neurological: She is alert and oriented to person, place, and time.   Pelvic: External genitalia:  no lesions              Urethra:  normal appearing urethra with no masses, tenderness or lesions              Bartholins and Skenes: normal                 Vagina: normal appearing vagina with normal color and discharge, no lesions              Cervix: normal appearance                      Bimanual Exam:  Uterus:  uterus is normal size, shape, consistency and nontender                                      Adnexa: left sided tenderness                                       Assessment:     LLQ pain  Questionable hemorrhagic vs functional  cyst    Plan:     PUS tomorrow CBC pelvic rest

## 2013-09-11 NOTE — Telephone Encounter (Signed)
Patient says she is having pelvic pain and would like an appointment today with Dr. Charlies Constable. I informed patient Dr.Lathrop's schedule is full today. Patient says she will see any provider.

## 2013-09-11 NOTE — Telephone Encounter (Signed)
Spoke with Dr. Charlies Constable, okay to come in at 1300. Spoke with patient. Appointment changed to 1:00, patient will come then.   Routing to provider for final review. Patient agreeable to disposition. Will close encounter

## 2013-09-11 NOTE — Telephone Encounter (Signed)
Spoke with patient. She states she is having L sided pain. States this same symptoms occurred last month nearing cycle but have returned. Patient was seen in ED 2/15 for L lower back pain and dx with sciatica. Patient unsure if this pain is gyn related but would like evaluation. Patient denies fevers, dysuria, vaginal bleeding, vaginal discharge, n/v/d. No constipation. Scheduled office visit for today with Dr. Charlies Constable at 1700, patient requests earlier appointment, advised would discuss with Dr. Charlies Constable and return her call.

## 2013-09-12 ENCOUNTER — Ambulatory Visit (INDEPENDENT_AMBULATORY_CARE_PROVIDER_SITE_OTHER): Payer: 59

## 2013-09-12 ENCOUNTER — Ambulatory Visit (INDEPENDENT_AMBULATORY_CARE_PROVIDER_SITE_OTHER): Payer: 59 | Admitting: Gynecology

## 2013-09-12 VITALS — BP 102/60 | Resp 12 | Ht 65.0 in | Wt 147.0 lb

## 2013-09-12 DIAGNOSIS — R109 Unspecified abdominal pain: Secondary | ICD-10-CM

## 2013-09-12 DIAGNOSIS — N83299 Other ovarian cyst, unspecified side: Secondary | ICD-10-CM

## 2013-09-12 DIAGNOSIS — N83209 Unspecified ovarian cyst, unspecified side: Secondary | ICD-10-CM

## 2013-09-12 NOTE — Progress Notes (Signed)
      Pt here for f/u of LLQ pain, suspect hemorrhagic cyst. The images were reviewed.  The uterus is normal, as is the EMS.  There is a thick walled cyst that is seen on the left, low levels of echos noted no CFD.  Cyst is 4.5x4.3cm.  Right normal appearing, min free fluid noted near left adnexa.  Images most suspicious for hemorrhagic ovarian cyst as suspected yesterday.   We discussed the natural course for hemorrhagic cysts and expect that the acute pain over the weekend was related to the bleed into the cyst and do not expect it to rupture at this point.  Pt was advised in pelvic rest. In that she hs had left sciatic pain that did not respond to treatment, I suggest we repeat her u/s in 6w to rule out endometrioma although is less likely, she is agreeable. Pt informed of her CBC, remarkable for elevated WBC otherwise normal. Questions addressed. 4m spent discussing ovarian cysts, >50% face to face

## 2013-09-14 ENCOUNTER — Encounter: Payer: Self-pay | Admitting: Gynecology

## 2013-09-14 NOTE — Telephone Encounter (Signed)
Spoke with patient at work number. She she confirms that pain is on Left side only, not right.   She describes left sided feelings as "a nagging feeling" and denies pain but states she feels it is worsening but not "anything that is isn't tolerable at this time". Advised she can do an NSAID for pain control. She has Lodine 500 mg that she uses prn for sciatica. She will try that for pain control right now.  Patient declines office visit today for evaluation. Patient denies nausea, vomiting, and fevers. Advised patient if any worsening of symptoms to please call our office any time to discuss. Patient will call back with any worsening pain, sudden onset of pain with nausea, vomiting or any fevers. Advised would discuss message with Dr. Quincy Simmonds and return call with any further instructions.  Patient is agreeable to plan.

## 2013-09-15 ENCOUNTER — Other Ambulatory Visit: Payer: Self-pay

## 2013-09-15 DIAGNOSIS — Z1231 Encounter for screening mammogram for malignant neoplasm of breast: Secondary | ICD-10-CM

## 2013-09-19 ENCOUNTER — Telehealth: Payer: Self-pay | Admitting: Family Medicine

## 2013-09-19 NOTE — Telephone Encounter (Signed)
[id number] [place], [place] - [address] AT [place] [phone] is requesting re-fill on the following:  etodolac (LODINE) 500 MG tablet cyclobenzaprine (FLEXERIL) 10 MG tablet

## 2013-09-20 ENCOUNTER — Ambulatory Visit: Payer: 59

## 2013-09-20 MED ORDER — ETODOLAC 500 MG PO TABS: ORAL_TABLET | ORAL | Status: DC

## 2013-09-20 MED ORDER — CYCLOBENZAPRINE HCL 10 MG PO TABS: ORAL_TABLET | ORAL | Status: DC

## 2013-09-20 NOTE — Telephone Encounter (Signed)
I sent both scripts e-scribe. 

## 2013-09-25 ENCOUNTER — Ambulatory Visit
Admission: RE | Admit: 2013-09-25 | Discharge: 2013-09-25 | Disposition: A | Discharge status: Home / Self Care | Payer: Self-pay | Source: Ambulatory Visit

## 2013-09-25 DIAGNOSIS — Z1231 Encounter for screening mammogram for malignant neoplasm of breast: Secondary | ICD-10-CM

## 2013-10-04 ENCOUNTER — Other Ambulatory Visit: Payer: Self-pay | Admitting: Gynecology

## 2013-10-04 ENCOUNTER — Encounter: Payer: Self-pay | Admitting: Gynecology

## 2013-10-04 DIAGNOSIS — N83299 Other ovarian cyst, unspecified side: Secondary | ICD-10-CM

## 2013-10-04 MED ORDER — TRAMADOL HCL ER 100 MG PO TB24: 100.0000 mg | ORAL_TABLET | Freq: Every day | ORAL | Status: DC

## 2013-10-04 NOTE — Telephone Encounter (Signed)
Spoke with patient. Patient states that she does have Lodine left but that it is providing her with limited relief. Requesting something else for pain. Advised Dr.Lathrop will write a prescription for Ultram but due to new regulations she will have to come in to office to pick prescription up. Will have Dr.Lathrop write prescription today for pick up tomorrow. Patient agreeable and states she will come in tomorrow to pick up prescription.    Routing to provider for final review. Patient agreeable to disposition. Will close encounter

## 2013-10-24 ENCOUNTER — Ambulatory Visit (INDEPENDENT_AMBULATORY_CARE_PROVIDER_SITE_OTHER): Payer: 59 | Admitting: Gynecology

## 2013-10-24 ENCOUNTER — Other Ambulatory Visit: Payer: Self-pay | Admitting: *Deleted

## 2013-10-24 ENCOUNTER — Ambulatory Visit (INDEPENDENT_AMBULATORY_CARE_PROVIDER_SITE_OTHER): Payer: 59

## 2013-10-24 VITALS — BP 92/60 | HR 68 | Resp 16 | Ht 65.0 in | Wt 149.0 lb

## 2013-10-24 DIAGNOSIS — M549 Dorsalgia, unspecified: Secondary | ICD-10-CM

## 2013-10-24 DIAGNOSIS — N83299 Other ovarian cyst, unspecified side: Secondary | ICD-10-CM

## 2013-10-24 DIAGNOSIS — R109 Unspecified abdominal pain: Secondary | ICD-10-CM

## 2013-10-24 DIAGNOSIS — N83209 Unspecified ovarian cyst, unspecified side: Secondary | ICD-10-CM

## 2013-10-24 DIAGNOSIS — M25559 Pain in unspecified hip: Secondary | ICD-10-CM

## 2013-10-24 NOTE — Progress Notes (Signed)
      Pt here for f/u u/s for complex adnexal mass could not rule out endometrioma vs hemorrhagic.  Pt reports still having left lower quadrant pain that can radiate to groin and left flank.   PUS images reviewed with pt.  Uterus normal, left ovary-no cyst noted, right normal No free fluid.  cyst hemorrhagic-resolved.  Pt relieved.  Discussed different etiologies for pelvic/flank  pain  Pt with history of scoliosis and also LS fx in past, recommend referral to PT to evaluate and treat Agrees If doesn't resolve will refer for pelvic floor PT Questions addressed 91m spent counseling, >50% face to face

## 2013-10-31 ENCOUNTER — Encounter: Payer: Self-pay | Admitting: Family Medicine

## 2013-10-31 ENCOUNTER — Ambulatory Visit (INDEPENDENT_AMBULATORY_CARE_PROVIDER_SITE_OTHER): Payer: 59 | Admitting: Family Medicine

## 2013-10-31 VITALS — BP 108/71 | HR 51 | Temp 98.7°F | Ht 65.0 in | Wt 150.0 lb

## 2013-10-31 DIAGNOSIS — R109 Unspecified abdominal pain: Secondary | ICD-10-CM

## 2013-10-31 LAB — POCT URINALYSIS DIPSTICK
Bilirubin, UA: NEGATIVE
Glucose, UA: NEGATIVE
Ketones, UA: NEGATIVE
Leukocytes, UA: NEGATIVE
NITRITE UA: NEGATIVE
Protein, UA: NEGATIVE
RBC UA: NEGATIVE
Spec Grav, UA: 1.01
UROBILINOGEN UA: 0.2
pH, UA: 7

## 2013-10-31 LAB — HEPATIC FUNCTION PANEL
ALT: 15 U/L (ref 0–35)
AST: 15 U/L (ref 0–37)
Albumin: 4.2 g/dL (ref 3.5–5.2)
Alkaline Phosphatase: 32 U/L — ABNORMAL LOW (ref 39–117)
Bilirubin, Direct: 0 mg/dL (ref 0.0–0.3)
TOTAL PROTEIN: 7 g/dL (ref 6.0–8.3)
Total Bilirubin: 0.3 mg/dL (ref 0.2–1.2)

## 2013-10-31 LAB — BASIC METABOLIC PANEL
BUN: 10 mg/dL (ref 6–23)
CHLORIDE: 102 meq/L (ref 96–112)
CO2: 28 meq/L (ref 19–32)
CREATININE: 0.9 mg/dL (ref 0.4–1.2)
Calcium: 9.6 mg/dL (ref 8.4–10.5)
GFR: 80.62 mL/min (ref >60.00)
Glucose, Bld: 82 mg/dL (ref 70–99)
Potassium: 3.8 mEq/L (ref 3.5–5.1)
Sodium: 137 mEq/L (ref 135–145)

## 2013-10-31 LAB — AMYLASE: AMYLASE: 80 U/L (ref 27–131)

## 2013-10-31 LAB — LIPASE: Lipase: 14 U/L (ref 11.0–59.0)

## 2013-10-31 NOTE — Progress Notes (Signed)
   Subjective:    Patient ID: Toni Edwards, female    DOB: April 24, 1976, 38 y.o.   MRN: [id number]  HPI Here for left flank pain which started 2 months ago. It waxes and wanes but is constantly present. No urinary symptoms and her BMs are normal. No nausea or fever. She saw her GYN several times for this and had several negative pregancy tests. An in office pelvic US revealed a left ovarian cyst during her first visit on 09-11-13, and it was first felt that the cyst was the source of the pain. However she had a follow up US which showed the cyst to have resolved, even though the pain persists. When she eats a meal she often feels bloated throughout the abdomen, and she gets sharp pains in the very same spot under the left ribs.    Review of Systems  Constitutional: Negative.   Respiratory: Negative.   Cardiovascular: Negative.   Gastrointestinal: Positive for abdominal pain and abdominal distention. Negative for nausea, vomiting, diarrhea, constipation, blood in stool and rectal pain.  Genitourinary: Negative.        Objective:   Physical Exam  Constitutional: She appears well-developed and well-nourished.  Cardiovascular: Normal rate, regular rhythm, normal heart sounds and intact distal pulses.   Pulmonary/Chest: Effort normal and breath sounds normal.  Abdominal: Soft. Bowel sounds are normal. She exhibits no distension and no mass. There is no tenderness. There is no rebound and no guarding.          Assessment & Plan:  The source of this pain is unclear, but it is certainly not of a gynecological nature. We will get labs today and set up a CT scan of the abdomen and pelvis.

## 2013-10-31 NOTE — Progress Notes (Signed)
Pre visit review using our clinic review tool, if applicable. No additional management support is needed unless otherwise documented below in the visit note. 

## 2013-11-02 ENCOUNTER — Ambulatory Visit: Payer: 59 | Admitting: Gynecology

## 2013-11-02 ENCOUNTER — Ambulatory Visit (INDEPENDENT_AMBULATORY_CARE_PROVIDER_SITE_OTHER)
Admission: RE | Admit: 2013-11-02 | Discharge: 2013-11-02 | Disposition: A | Discharge status: Home / Self Care | Payer: 59 | Source: Ambulatory Visit | Attending: Family Medicine | Admitting: Family Medicine

## 2013-11-02 DIAGNOSIS — R109 Unspecified abdominal pain: Secondary | ICD-10-CM

## 2013-11-02 MED ORDER — IOHEXOL 300 MG/ML  SOLN
100.0000 mL | Freq: Once | INTRAMUSCULAR | Status: AC | PRN
  Administered 2013-11-02: 100 mL via INTRAVENOUS

## 2013-11-03 ENCOUNTER — Ambulatory Visit (INDEPENDENT_AMBULATORY_CARE_PROVIDER_SITE_OTHER): Payer: 59 | Admitting: Gynecology

## 2013-11-03 ENCOUNTER — Encounter: Payer: Self-pay | Admitting: Gynecology

## 2013-11-03 VITALS — BP 112/78 | HR 70 | Resp 14 | Ht 65.0 in | Wt 150.0 lb

## 2013-11-03 DIAGNOSIS — Z0000 Encounter for general adult medical examination without abnormal findings: Secondary | ICD-10-CM

## 2013-11-03 DIAGNOSIS — Z01419 Encounter for gynecological examination (general) (routine) without abnormal findings: Secondary | ICD-10-CM

## 2013-11-03 DIAGNOSIS — Z3009 Encounter for other general counseling and advice on contraception: Secondary | ICD-10-CM

## 2013-11-03 NOTE — Patient Instructions (Signed)
Levonorgestrel intrauterine device (IUD) What is this medicine? LEVONORGESTREL IUD (LEE voe nor jes trel) is a contraceptive (birth control) device. The device is placed inside the uterus by a healthcare professional. It is used to prevent pregnancy and can also be used to treat heavy bleeding that occurs during your period. Depending on the device, it can be used for 3 to 5 years. This medicine may be used for other purposes; ask your health care provider or pharmacist if you have questions. COMMON BRAND NAME(S): Mirena, Skyla What should I tell my health care provider before I take this medicine? They need to know if you have any of these conditions: -abnormal Pap smear -cancer of the breast, uterus, or cervix -diabetes -endometritis -genital or pelvic infection now or in the past -have more than one sexual partner or your partner has more than one partner -heart disease -history of an ectopic or tubal pregnancy -immune system problems -IUD in place -liver disease or tumor -problems with blood clots or take blood-thinners -use intravenous drugs -uterus of unusual shape -vaginal bleeding that has not been explained -an unusual or allergic reaction to levonorgestrel, other hormones, silicone, or polyethylene, medicines, foods, dyes, or preservatives -pregnant or trying to get pregnant -breast-feeding How should I use this medicine? This device is placed inside the uterus by a health care professional. Talk to your pediatrician regarding the use of this medicine in children. Special care may be needed. Overdosage: If you think you have taken too much of this medicine contact a poison control center or emergency room at once. NOTE: This medicine is only for you. Do not share this medicine with others. What if I miss a dose? This does not apply. What may interact with this medicine? Do not take this medicine with any of the following  medications: -amprenavir -bosentan -fosamprenavir This medicine may also interact with the following medications: -aprepitant -barbiturate medicines for inducing sleep or treating seizures -bexarotene -griseofulvin -medicines to treat seizures like carbamazepine, ethotoin, felbamate, oxcarbazepine, phenytoin, topiramate -modafinil -pioglitazone -rifabutin -rifampin -rifapentine -some medicines to treat HIV infection like atazanavir, indinavir, lopinavir, nelfinavir, tipranavir, ritonavir -St. John's wort -warfarin This list may not describe all possible interactions. Give your health care provider a list of all the medicines, herbs, non-prescription drugs, or dietary supplements you use. Also tell them if you smoke, drink alcohol, or use illegal drugs. Some items may interact with your medicine. What should I watch for while using this medicine? Visit your doctor or health care professional for regular check ups. See your doctor if you or your partner has sexual contact with others, becomes HIV positive, or gets a sexual transmitted disease. This product does not protect you against HIV infection (AIDS) or other sexually transmitted diseases. You can check the placement of the IUD yourself by reaching up to the top of your vagina with clean fingers to feel the threads. Do not pull on the threads. It is a good habit to check placement after each menstrual period. Call your doctor right away if you feel more of the IUD than just the threads or if you cannot feel the threads at all. The IUD may come out by itself. You may become pregnant if the device comes out. If you notice that the IUD has come out use a backup birth control method like condoms and call your health care provider. Using tampons will not change the position of the IUD and are okay to use during your period. What side effects may I   notice from receiving this medicine? Side effects that you should report to your doctor or  health care professional as soon as possible: -allergic reactions like skin rash, itching or hives, swelling of the face, lips, or tongue -fever, flu-like symptoms -genital sores -high blood pressure -no menstrual period for 6 weeks during use -pain, swelling, warmth in the leg -pelvic pain or tenderness -severe or sudden headache -signs of pregnancy -stomach cramping -sudden shortness of breath -trouble with balance, talking, or walking -unusual vaginal bleeding, discharge -yellowing of the eyes or skin Side effects that usually do not require medical attention (report to your doctor or health care professional if they continue or are bothersome): -acne -breast pain -change in sex drive or performance -changes in weight -cramping, dizziness, or faintness while the device is being inserted -headache -irregular menstrual bleeding within first 3 to 6 months of use -nausea This list may not describe all possible side effects. Call your doctor for medical advice about side effects. You may report side effects to FDA at 1-800-FDA-1088. Where should I keep my medicine? This does not apply. NOTE: This sheet is a summary. It may not cover all possible information. If you have questions about this medicine, talk to your doctor, pharmacist, or health care provider.  2014, Elsevier/Gold Standard. (2011-06-18 13:54:04)  

## 2013-11-03 NOTE — Progress Notes (Signed)
38 y.o. Divorced Caucasian female  G2P2002 for annual exam. Pt is currently sexually active.  Pt is using valtrex as needed.  Pt is happy with camilla, stopped smoking 56m ago with vap.  Pt still has LLQ pain, had CT 3d ago-normal, has scoliosis.  Pt interested in alternative contraceptive options.  Patient's last menstrual period was 10/16/2013.          Sexually active: yes  The current method of family planning is OCP (estrogen/progesterone).    Exercising: no  The patient does not participate in regular exercise at present. Last pap: 11/07/12 NEG HR HPV Alcohol: 2-3 drinks/wk on average  Tobacco: no BSE: occassionally  Labs: Alysia Penna, MD    ;  Urine: Negative    Health Maintenance  Topic Date Due  . Tetanus/tdap  03/27/1997  . Influenza Vaccine  12/30/2013  . Pap Smear  11/08/2015    Family History  Problem Relation Age of Onset  . Alcohol abuse    . Breast cancer    . Hyperlipidemia    . Breast cancer Mother 34  . Breast cancer Maternal Grandmother 20    Patient Active Problem List   Diagnosis Date Noted  . ANXIETY STATE, UNSPECIFIED 03/05/2010  . ADHD 10/10/2009  . LOW BACK PAIN, CHRONIC 04/29/2009  . SCOLIOSIS 04/29/2009  . BREAST MASS 10/30/2008  . HIP STRAIN, RIGHT 08/15/2008  . NUMBNESS 03/01/2008  . PHARYNGITIS 06/24/2007    Past Medical History  Diagnosis Date  . Fracture of lumbar vertebra 1995  . Scoliosis   . Breast nodule     by exam and [place] in July 2010 seen by Dr Donne Hazel, resolved on its own  . ADHD (attention deficit hyperactivity disorder)   . 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 Genital condyloma, female     History reviewed. No pertinent past surgical history.  Allergies: Review of patient's allergies indicates no known allergies.  Current Outpatient Prescriptions  Medication Sig Dispense Refill  . cyclobenzaprine (FLEXERIL) 10 MG tablet TAKE 1 TABLET BY MOUTH EVERY 8 HOURS AS NEEDED FOR MUSCLE SPASMS  90 tablet  1  . etodolac (LODINE) 500 MG  tablet TAKE 1 TABLET (500 MG TOTAL) BY MOUTH 2 (TWO) TIMES DAILY.  60 tablet  1  . norethindrone (MICRONOR,CAMILA,ERRIN) 0.35 MG tablet Take 1 tablet (0.35 mg total) by mouth daily.  1 Package  11  . valACYclovir (VALTREX) 500 MG tablet TAKE 1 TABLET BY MOUTH DAILY, INCREASE TO 2 A DAY FOR 3 DAYS AT ONSET OF OUT BREAK  34 tablet  10   No current facility-administered medications for this visit.    ROS: Pertinent items are noted in HPI.  Exam:    BP 112/78  Pulse 70  Resp 14  Ht 5\' 5"  (1.651 m)  Wt 150 lb (68.04 kg)  BMI 24.96 kg/m2  LMP 10/16/2013 Weight change: @WEIGHTCHANGE @ Last 3 height recordings:  Ht Readings from Last 3 Encounters:  11/03/13 5\' 5"  (1.651 m)  10/31/13 5\' 5"  (1.651 m)  10/24/13 5\' 5"  (1.651 m)   General appearance: alert, cooperative and appears stated age Head: Normocephalic, without obvious abnormality, atraumatic Neck: no adenopathy, no carotid bruit, no JVD, supple, symmetrical, trachea midline and thyroid not enlarged, symmetric, no tenderness/mass/nodules Lungs: clear to auscultation bilaterally Breasts: normal appearance, no masses or tenderness Heart: regular rate and rhythm, S1, S2 normal, no murmur, click, rub or gallop Abdomen: soft, non-tender; bowel sounds normal; no masses,  no organomegaly Extremities: extremities normal, atraumatic, no cyanosis or  edema Skin: Skin color, texture, turgor normal. No rashes or lesions Lymph nodes: Cervical, supraclavicular, and axillary nodes normal. no inguinal nodes palpated Neurologic: Grossly normal   Pelvic: External genitalia:  no lesions              Urethra: normal appearing urethra with no masses, tenderness or lesions              Bartholins and Skenes: Bartholin's, Urethra, Skene's normal                 Vagina: normal appearing vagina with normal color and discharge, no lesions              Cervix: normal appearance              Pap taken: no        Bimanual Exam:  Uterus:  uterus is normal  size, shape, consistency and nontender                                      Adnexa:    normal adnexa in size, nontender and no masses                                      Rectovaginal: Confirms                                      Anus:  normal sphincter tone, no lesions  A: well woman Contraceptive management     P: counseled on breast self exam, adequate intake of calcium and vitamin D, diet and exercise Pt's partner without children, unsure if she want to conceive-reviewed mirena IUD, nexplanon Recommend referral to PT return annually or prn   An After Visit Summary was printed and given to the patient.

## 2013-11-06 ENCOUNTER — Telehealth: Payer: Self-pay | Admitting: Gynecology

## 2013-11-06 NOTE — Telephone Encounter (Signed)
Called patient to inform that she is scheduled at [facility] at [place], [place] [address] on Thurdays June 11 @ 1430. There was no answer and voicemail was full.

## 2013-11-06 NOTE — Telephone Encounter (Signed)
Patient returned call and was advised of appointment.

## 2013-11-07 ENCOUNTER — Encounter: Payer: Self-pay | Admitting: Family Medicine

## 2013-11-08 NOTE — Telephone Encounter (Signed)
Per Dr. Sarajane Jews, the lab results are normal.

## 2013-11-08 NOTE — Telephone Encounter (Signed)
For some reason these labs got signed off without an interpretation. They were all NORMAL. Please tell the patient

## 2013-11-09 ENCOUNTER — Ambulatory Visit: Payer: 59 | Attending: Gynecology | Admitting: Physical Therapy

## 2013-11-09 DIAGNOSIS — IMO0001 Reserved for inherently not codable concepts without codable children: Secondary | ICD-10-CM | POA: Insufficient documentation

## 2013-11-09 DIAGNOSIS — M2420 Disorder of ligament, unspecified site: Secondary | ICD-10-CM | POA: Insufficient documentation

## 2013-11-09 DIAGNOSIS — M629 Disorder of muscle, unspecified: Secondary | ICD-10-CM | POA: Insufficient documentation

## 2013-11-09 DIAGNOSIS — M545 Low back pain, unspecified: Secondary | ICD-10-CM | POA: Insufficient documentation

## 2013-11-09 DIAGNOSIS — R5381 Other malaise: Secondary | ICD-10-CM | POA: Insufficient documentation

## 2013-11-10 ENCOUNTER — Telehealth: Payer: Self-pay | Admitting: Gynecology

## 2013-11-10 NOTE — Telephone Encounter (Signed)
Spoke with patient. Advised that per benefits quote received, IUD and insertion is covered at 100%. There will be 0 patient liability. Patient is to call within the first 5 days of her cycle to schedule insertion. °

## 2013-11-14 ENCOUNTER — Other Ambulatory Visit: Payer: Self-pay | Admitting: Gynecology

## 2013-11-16 ENCOUNTER — Other Ambulatory Visit: Payer: Self-pay | Admitting: Family Medicine

## 2013-11-21 ENCOUNTER — Ambulatory Visit: Payer: 59 | Admitting: Physical Therapy

## 2013-11-23 ENCOUNTER — Ambulatory Visit: Payer: 59 | Admitting: Physical Therapy

## 2013-11-28 ENCOUNTER — Ambulatory Visit: Payer: 59 | Admitting: Physical Therapy

## 2013-11-30 ENCOUNTER — Encounter: Payer: 59 | Admitting: Physical Therapy

## 2013-11-30 MED ORDER — NORETHINDRONE 0.35 MG PO TABS: 1.0000 | ORAL_TABLET | Freq: Every day | ORAL | Status: DC

## 2013-11-30 NOTE — Telephone Encounter (Signed)
Spoke with patient. Patient was seen on 11/03/13 for annual exam with Dr.Lathrop. Patient has been taking camilla with no complications. IUD options and insertion were discussed at that office visit. Patient went to pharmacy to pick up OCP and did not have refills. "I want to get the IUD but I can not guarantee that I will be available during that small window and don't want to be without birth control." Advised would send rx for three months over to pharmacy of choice so that patient will still have OCP while waiting to schedule insertion. Advised to call back with first day of menses to schedule. Patient agreeable and verbalizes understanding.  Routing to provider for final review. Patient agreeable to disposition. Will close encounter

## 2013-11-30 NOTE — Telephone Encounter (Signed)
Patient states she was picking up some of her medications and was told by the pharmacy that she needed to contact us regarding her bc

## 2013-12-05 ENCOUNTER — Encounter: Payer: 59 | Admitting: Physical Therapy

## 2013-12-07 ENCOUNTER — Ambulatory Visit: Payer: 59 | Attending: Gynecology | Admitting: Physical Therapy

## 2013-12-07 DIAGNOSIS — M545 Low back pain, unspecified: Secondary | ICD-10-CM | POA: Insufficient documentation

## 2013-12-07 DIAGNOSIS — IMO0001 Reserved for inherently not codable concepts without codable children: Secondary | ICD-10-CM | POA: Diagnosis present

## 2013-12-07 DIAGNOSIS — M2420 Disorder of ligament, unspecified site: Secondary | ICD-10-CM | POA: Insufficient documentation

## 2013-12-07 DIAGNOSIS — R5381 Other malaise: Secondary | ICD-10-CM | POA: Diagnosis not present

## 2013-12-07 DIAGNOSIS — M629 Disorder of muscle, unspecified: Secondary | ICD-10-CM | POA: Diagnosis not present

## 2013-12-08 ENCOUNTER — Telehealth: Payer: Self-pay | Admitting: Gynecology

## 2013-12-08 NOTE — Telephone Encounter (Signed)
Tried to reach patient at number provided [phone]. No answer and message states that the voicemail box is full. Was able to leave pager number for patient to return call. Will try again before the end of the day.

## 2013-12-08 NOTE — Telephone Encounter (Signed)
Pt says the wrong brand of birth control was called in to her pharmacy.

## 2013-12-15 MED ORDER — NORETHINDRONE 0.35 MG PO TABS: 1.0000 | ORAL_TABLET | Freq: Every day | ORAL | Status: DC

## 2013-12-15 NOTE — Telephone Encounter (Signed)
Patient returned call.  She states she was not given [name] and that is what she prefers. Advised that she will need to contact pharmacy and let them know that she prefers camilla and not another type of Norethindrone 0.35 mg. Advised that the pharmacy dispenses the generic and we cannot guarantee what brand of generic the pharmacy will dispense but that the hormones in the generics are the same.  I advised I would send one pack to pharmacy and write that patient prefers [name].  Patient is okay with that, she is waiting for cycle to call and have IUD placed.  Routing to provider for final review. Patient agreeable to disposition. Will close encounter

## 2013-12-15 NOTE — Telephone Encounter (Signed)
Mailbox full again.

## 2014-01-23 ENCOUNTER — Encounter: Payer: Self-pay | Admitting: Family Medicine

## 2014-01-23 ENCOUNTER — Ambulatory Visit (INDEPENDENT_AMBULATORY_CARE_PROVIDER_SITE_OTHER): Payer: 59 | Admitting: Family Medicine

## 2014-01-23 VITALS — BP 112/78 | HR 58 | Temp 99.2°F | Ht 65.0 in | Wt 148.0 lb

## 2014-01-23 DIAGNOSIS — G500 Trigeminal neuralgia: Secondary | ICD-10-CM | POA: Insufficient documentation

## 2014-01-23 MED ORDER — PREDNISONE 10 MG PO TABS: ORAL_TABLET | ORAL | Status: DC

## 2014-01-23 NOTE — Progress Notes (Signed)
   Subjective:    Patient ID: Toni Edwards, female    DOB: [birth year], 38 y.o.   MRN: [id number]  HPI Here for 3 weeks of intermittent pain on the left side of the head which starts behind the left ear and then spreads over the temple area and the face. No visible rash. No ST or fever. She describes a warm sensation that then turns into a pain. No neck pain. No pain on chewing.    Review of Systems  Constitutional: Negative.   HENT: Negative.   Eyes: Negative.   Neurological: Negative.        Objective:   Physical Exam  Constitutional: She is oriented to person, place, and time. She appears well-developed and well-nourished.  HENT:  Head: Normocephalic and atraumatic.  Right Ear: External ear normal.  Left Ear: External ear normal.  Nose: Nose normal.  Mouth/Throat: Oropharynx is clear and moist.  Eyes: Conjunctivae and EOM are normal. Pupils are equal, round, and reactive to light.  Neck: Normal range of motion. Neck supple. No thyromegaly present.  Lymphadenopathy:    She has no cervical adenopathy.  Neurological: She is alert and oriented to person, place, and time. No cranial nerve deficit.          Assessment & Plan:  Possible trigeminal neuralgia. Try a steroid taper pack.

## 2014-01-23 NOTE — Progress Notes (Signed)
Pre visit review using our clinic review tool, if applicable. No additional management support is needed unless otherwise documented below in the visit note. 

## 2014-03-27 ENCOUNTER — Other Ambulatory Visit: Payer: Self-pay | Admitting: Family Medicine

## 2014-04-02 ENCOUNTER — Encounter: Payer: Self-pay | Admitting: Family Medicine

## 2014-04-09 ENCOUNTER — Telehealth: Payer: Self-pay | Admitting: Gynecology

## 2014-04-09 NOTE — Telephone Encounter (Signed)
Left message for pt to call and schedule appointment she requested thru mychart.

## 2014-04-09 NOTE — Telephone Encounter (Signed)
Appointment has been made on 04/13/14 @ 10:30 with French Ana, CNM  Encounter closed

## 2014-04-13 ENCOUNTER — Ambulatory Visit (INDEPENDENT_AMBULATORY_CARE_PROVIDER_SITE_OTHER): Payer: 59 | Admitting: Certified Nurse Midwife

## 2014-04-13 ENCOUNTER — Encounter: Payer: Self-pay | Admitting: Certified Nurse Midwife

## 2014-04-13 VITALS — BP 90/64 | HR 68 | Resp 16 | Ht 65.0 in | Wt 144.0 lb

## 2014-04-13 DIAGNOSIS — N912 Amenorrhea, unspecified: Secondary | ICD-10-CM

## 2014-04-13 DIAGNOSIS — Z3201 Encounter for pregnancy test, result positive: Secondary | ICD-10-CM

## 2014-04-13 LAB — POCT URINE PREGNANCY: Preg Test, Ur: POSITIVE

## 2014-04-13 NOTE — Progress Notes (Signed)
38 y.o.  Partnered white female [phone] here with complaint of amenorrhea with positive home UPT.Patient excited, but shocked, she has been on OCP with regular use. Stopped when no menses occurred. Denies any other medication use now that she knows she is pregnant. Small amount alcohol prior to positive UPT. Patient is a smoker and was with her two pregnancies, but not smoking as much now. Partner excited and planning marriage, prior to pregnancy. Patient has already being looking into OB providers and plans to decide this week. ? [facility]. Patient eating varied diet and drinking water only with occasional other beverages, no caffeine. Aware of food that she should not eat from before. Complaining of some fatigue and breast tenderness. No nausea as of yet and had no problems with other pregnancies. Has started on OTC prenatal vitamins.  O: Healthy female WDWN Affect:normal History of HSV History of condyloma AEX normal here 11/03/13  A: Amenorrhea with Positive UPT, OCP failure 5 wks 5 d per LMP only. EDC 12-09-13. TDAP due Smoker  P: Discussed importance of prenatal care and screening options due to [facility]. Patient plans to have screening done if indicated. Discussed warning signs of early pregnancy and need to advise here, if has not started OB care. Discussed nutritional needs and comfort measures in early pregnancy. Offered viability PUS here, unsure if she would like to do here. Will advise if decides so can be scheduled. Discussed risks of smoking in pregnancy and advised to continue cutting dow. Discussed need for TDAP in pregnancy with Pertussis concern. Also discussed flu vaccine is recommended, but can discuss with provider when starts OB care. Wished well with her pregnancy. Questions addressed.   30 minutes spent with patient  in face to face counseling regarding pregnancy and prenatal care information.

## 2014-04-13 NOTE — Patient Instructions (Signed)
Prenatal Care  WHAT IS PRENATAL CARE?  Prenatal care means health care during your pregnancy, before your baby is born. It is very important to take care of yourself and your baby during your pregnancy by:   Getting early prenatal care. If you know you are pregnant, or think you might be pregnant, call your health care provider as soon as possible. Schedule a visit for a prenatal exam.  Getting regular prenatal care. Follow your health care provider's schedule for blood and other necessary tests. Do not miss appointments.  Doing everything you can to keep yourself and your baby healthy during your pregnancy.  Getting complete care. Prenatal care should include evaluation of the medical, dietary, educational, psychological, and social needs of you and your significant other. The medical and genetic history of your family and the family of your baby's father should be discussed with your health care provider.  Discussing with your health care provider:  Prescription, over-the-counter, and herbal medicines that you take.  Any history of substance abuse, alcohol use, smoking, and illegal drug use.  Any history of domestic abuse and violence.  Immunizations you have received.  Your nutrition and diet.  The amount of exercise you do.  Any environmental and occupational hazards to which you are exposed.  History of sexually transmitted infections for both you and your partner.  Previous pregnancies you have had. WHY IS PRENATAL CARE SO IMPORTANT?  By regularly seeing your health care provider, you help ensure that problems can be identified early so that they can be treated as soon as possible. Other problems might be prevented. Many studies have shown that early and regular prenatal care is important for the health of mothers and their babies.  HOW CAN I TAKE CARE OF MYSELF WHILE I AM PREGNANT?  Here are ways to take care of yourself and your baby:   Start or continue taking your  multivitamin with 400 micrograms (mcg) of folic acid every day.  Get early and regular prenatal care. It is very important to see a health care provider during your pregnancy. Your health care provider will check at each visit to make sure that you and your baby are healthy. If there are any problems, action can be taken right away to help you and your baby.  Eat a healthy diet that includes:  Fruits.  Vegetables.  Foods low in saturated fat.  Whole grains.  Calcium-rich foods, such as milk, yogurt, and hard cheeses.  Drink 6-8 glasses of liquids a day.  Unless your health care provider tells you not to, try to be physically active for 30 minutes, most days of the week. If you are pressed for time, you can get your activity in through 10-minute segments, three times a day.  Do not smoke, drink alcohol, or use drugs. These can cause long-term damage to your baby. Talk with your health care provider about steps to take to stop smoking. Talk with a member of your faith community, a counselor, a trusted friend, or your health care provider if you are concerned about your alcohol or drug use.  Ask your health care provider before taking any medicine, even over-the-counter medicines. Some medicines are not safe to take during pregnancy.  Get plenty of rest and sleep.  Avoid hot tubs and saunas during pregnancy.  Do not have X-rays taken unless absolutely necessary and with the recommendation of your health care provider. A lead shield can be placed on your abdomen to protect your baby when   X-rays are taken in other parts of your body.  Do not empty the cat litter when you are pregnant. It may contain a parasite that causes an infection called toxoplasmosis, which can cause birth defects. Also, use gloves when working in garden areas used by cats.  Do not eat uncooked or undercooked meats or fish.  Do not eat soft, mold-ripened cheeses (Brie, Camembert, and chevre) or soft, blue-veined  cheese (Danish blue and Roquefort).  Stay away from toxic chemicals like:  Insecticides.  Solvents (some cleaners or paint thinners).  Lead.  Mercury.  Sexual intercourse may continue until the end of the pregnancy, unless you have a medical problem or there is a problem with the pregnancy and your health care provider tells you not to.  Do not wear high-heel shoes, especially during the second half of the pregnancy. You can lose your balance and fall.  Do not take long trips, unless absolutely necessary. Be sure to see your health care provider before going on the trip.  Do not sit in one position for more than 2 hours when on a trip.  Take a copy of your medical records when going on a trip. Know where a hospital is located in the city you are visiting, in case of an emergency.  Most dangerous household products will have pregnancy warnings on their labels. Ask your health care provider about products if you are unsure.  Limit or eliminate your caffeine intake from coffee, tea, sodas, medicines, and chocolate.  Many women continue working through pregnancy. Staying active might help you stay healthier. If you have a question about the safety or the hours you work at your particular job, talk with your health care provider.  Get informed:  Read books.  Watch videos.  Go to childbirth classes for you and your significant other.  Talk with experienced moms.  Ask your health care provider about childbirth education classes for you and your partner. Classes can help you and your partner prepare for the birth of your baby.  Ask about a baby doctor (pediatrician) and methods and pain medicine for labor, delivery, and possible cesarean delivery. HOW OFTEN SHOULD I SEE MY HEALTH CARE PROVIDER DURING PREGNANCY?  Your health care provider will give you a schedule for your prenatal visits. You will have visits more often as you get closer to the end of your pregnancy. An average  pregnancy lasts about 40 weeks.  A typical schedule includes visiting your health care provider:   About once each month during your first 6 months of pregnancy.  Every 2 weeks during the next 2 months.  Weekly in the last month, until the delivery date. Your health care provider will probably want to see you more often if:  You are older than 35 years.  Your pregnancy is high risk because you have certain health problems or problems with the pregnancy, such as:  Diabetes.  High blood pressure.  The baby is not growing on schedule, according to the dates of the pregnancy. Your health care provider will do special tests to make sure you and your baby are not having any serious problems. WHAT HAPPENS DURING PRENATAL VISITS?   At your first prenatal visit, your health care provider will do a physical exam and talk to you about your health history and the health history of your partner and your family. Your health care provider will be able to tell you what date to expect your baby to be born on.  Your   first physical exam will include checks of your blood pressure, measurements of your height and weight, and an exam of your pelvic organs. Your health care provider will do a Pap test if you have not had one recently and will do cultures of your cervix to make sure there is no infection.  At each prenatal visit, there will be tests of your blood, urine, blood pressure, weight, and the progress of the baby will be checked.  At your later prenatal visits, your health care provider will check how you are doing and how your baby is developing. You may have a number of tests done as your pregnancy progresses.  Ultrasound exams are often used to check on your baby's growth and health.  You may have more urine and blood tests, as well as special tests, if needed. These may include amniocentesis to examine fluid in the pregnancy sac, stress tests to check how the baby responds to contractions, or a  biophysical profile to measure your baby's well-being. Your health care provider will explain the tests and why they are necessary.  You should be tested for high blood sugar (gestational diabetes) between the 24th and 28th weeks of your pregnancy.  You should discuss with your health care provider your plans to breastfeed or bottle-feed your baby.  Each visit is also a chance for you to learn about staying healthy during pregnancy and to ask questions. Document Released: 05/21/2003 Document Revised: 05/23/2013 Document Reviewed: 08/02/2013 ExitCare Patient Information 2015 [employer]. This information is not intended to replace advice given to you by your health care provider. Make sure you discuss any questions you have with your health care provider.  

## 2014-04-15 NOTE — Progress Notes (Signed)
Reviewed personally.  M. Suzanne Amely Voorheis, MD.  

## 2014-04-18 ENCOUNTER — Telehealth: Payer: Self-pay | Admitting: Certified Nurse Midwife

## 2014-04-18 NOTE — Telephone Encounter (Signed)
Patient is pregnant and has chosen Dr. Garwin Brothers at [facility] for her [facility] care. FYI.  Cc: Alinda Sierras for assistance with sending records.

## 2014-05-11 LAB — OB RESULTS CONSOLE GC/CHLAMYDIA
Chlamydia: NEGATIVE
Gonorrhea: NEGATIVE

## 2014-05-11 LAB — OB RESULTS CONSOLE ABO/RH: RH TYPE: POSITIVE

## 2014-05-11 LAB — OB RESULTS CONSOLE HIV ANTIBODY (ROUTINE TESTING): HIV: NONREACTIVE

## 2014-05-11 LAB — OB RESULTS CONSOLE HEPATITIS B SURFACE ANTIGEN: Hepatitis B Surface Ag: NEGATIVE

## 2014-05-11 LAB — OB RESULTS CONSOLE ANTIBODY SCREEN: Antibody Screen: NEGATIVE

## 2014-05-11 LAB — OB RESULTS CONSOLE RPR: RPR: NONREACTIVE

## 2014-05-11 LAB — OB RESULTS CONSOLE RUBELLA ANTIBODY, IGM: RUBELLA: IMMUNE

## 2014-05-30 ENCOUNTER — Other Ambulatory Visit: Payer: Self-pay | Admitting: Certified Nurse Midwife

## 2014-05-30 NOTE — Telephone Encounter (Signed)
Per note patient currently pregnant and will need rx per OB if recommended.  denied

## 2014-05-30 NOTE — Telephone Encounter (Signed)
Medication refill request: Veltrex Last AEX:  11/03/13 Next AEX: not scheduled  Last MMG (if hormonal medication request): 09/26/13 [id number]:neg Refill authorized: 06/14/13 #34/10 refills. Please advise

## 2014-05-30 NOTE — Telephone Encounter (Signed)
Patient notified. She states she didn't request prescription.

## 2014-06-01 NOTE — L&D Delivery Note (Signed)
Delivery Note At 3:34 PM a viable and healthy female was delivered via Vaginal, Spontaneous Delivery (Presentation: Right Occiput Anterior).  APGAR: 8, 9; weight 7 lb 11.6 oz (3504 g).   Placenta status: Intact, Spontaneous, Pathology.  Cord: velamentous cord insertion . CAN x 1 reducible  3 vessels with the following complications: None.  Cord pH: none  Anesthesia: None  Episiotomy: None Lacerations: None Suture Repair: none Est. Blood Loss (mL): 119  Mom to postpartum.  Baby to Couplet care / Skin to Skin.  Ibraham Levi A 12/07/2014, 5:30 PM

## 2014-09-04 ENCOUNTER — Inpatient Hospital Stay (HOSPITAL_COMMUNITY): Admission: AD | Admit: 2014-09-04 | Payer: Self-pay | Source: Ambulatory Visit | Admitting: Obstetrics and Gynecology

## 2014-11-01 ENCOUNTER — Institutional Professional Consult (permissible substitution): Payer: Self-pay | Admitting: Pediatrics

## 2014-11-05 LAB — OB RESULTS CONSOLE GBS: STREP GROUP B AG: POSITIVE

## 2014-11-09 ENCOUNTER — Ambulatory Visit: Payer: 59 | Admitting: Gynecology

## 2014-11-12 ENCOUNTER — Other Ambulatory Visit: Payer: Self-pay | Admitting: Family Medicine

## 2014-11-16 ENCOUNTER — Ambulatory Visit (INDEPENDENT_AMBULATORY_CARE_PROVIDER_SITE_OTHER): Payer: Self-pay | Admitting: Pediatrics

## 2014-11-16 DIAGNOSIS — Z7681 Expectant parent(s) prebirth pediatrician visit: Secondary | ICD-10-CM

## 2014-11-17 NOTE — Progress Notes (Signed)
Prenatal counseling for impending newborn done-- Z76.81  

## 2014-11-30 ENCOUNTER — Telehealth (HOSPITAL_COMMUNITY): Payer: Self-pay | Admitting: *Deleted

## 2014-11-30 ENCOUNTER — Encounter (HOSPITAL_COMMUNITY): Payer: Self-pay | Admitting: *Deleted

## 2014-11-30 NOTE — Telephone Encounter (Signed)
Preadmission screen  

## 2014-12-06 ENCOUNTER — Other Ambulatory Visit: Payer: Self-pay | Admitting: Obstetrics and Gynecology

## 2014-12-06 ENCOUNTER — Inpatient Hospital Stay (HOSPITAL_COMMUNITY): Admission: RE | Admit: 2014-12-06 | Payer: Self-pay | Source: Ambulatory Visit

## 2014-12-07 ENCOUNTER — Inpatient Hospital Stay (HOSPITAL_COMMUNITY)
Admission: RE | Admit: 2014-12-07 | Discharge: 2014-12-08 | DRG: 775 | Disposition: A | Discharge status: Home / Self Care | Payer: 59 | Source: Ambulatory Visit | Attending: Obstetrics and Gynecology | Admitting: Obstetrics and Gynecology

## 2014-12-07 ENCOUNTER — Encounter (HOSPITAL_COMMUNITY): Payer: Self-pay

## 2014-12-07 DIAGNOSIS — Z3A39 39 weeks gestation of pregnancy: Secondary | ICD-10-CM | POA: Diagnosis present

## 2014-12-07 DIAGNOSIS — O99824 Streptococcus B carrier state complicating childbirth: Secondary | ICD-10-CM | POA: Diagnosis present

## 2014-12-07 DIAGNOSIS — O403XX0 Polyhydramnios, third trimester, not applicable or unspecified: Principal | ICD-10-CM | POA: Diagnosis present

## 2014-12-07 LAB — COMPREHENSIVE METABOLIC PANEL
ALT: 15 U/L (ref 14–54)
ANION GAP: 11 (ref 5–15)
AST: 24 U/L (ref 15–41)
Albumin: 2.7 g/dL — ABNORMAL LOW (ref 3.5–5.0)
Alkaline Phosphatase: 132 U/L — ABNORMAL HIGH (ref 38–126)
BILIRUBIN TOTAL: 0.6 mg/dL (ref 0.3–1.2)
BUN: 7 mg/dL (ref 6–20)
CO2: 18 mmol/L — ABNORMAL LOW (ref 22–32)
CREATININE: 0.66 mg/dL (ref 0.44–1.00)
Calcium: 8.5 mg/dL — ABNORMAL LOW (ref 8.9–10.3)
Chloride: 103 mmol/L (ref 101–111)
GFR calc non Af Amer: >60 mL/min (ref >60)
Glucose, Bld: 98 mg/dL (ref 65–99)
POTASSIUM: 3.6 mmol/L (ref 3.5–5.1)
Sodium: 132 mmol/L — ABNORMAL LOW (ref 135–145)
Total Protein: 5.8 g/dL — ABNORMAL LOW (ref 6.5–8.1)

## 2014-12-07 LAB — CBC
HEMATOCRIT: 35.4 % — AB (ref 36.0–46.0)
HEMATOCRIT: 36 % (ref 36.0–46.0)
HEMOGLOBIN: 12 g/dL (ref 12.0–15.0)
Hemoglobin: 12.4 g/dL (ref 12.0–15.0)
MCH: 31.1 pg (ref 26.0–34.0)
MCH: 31.4 pg (ref 26.0–34.0)
MCHC: 33.9 g/dL (ref 30.0–36.0)
MCHC: 34.4 g/dL (ref 30.0–36.0)
MCV: 91.1 fL (ref 78.0–100.0)
MCV: 91.7 fL (ref 78.0–100.0)
Platelets: 192 10*3/uL (ref 150–400)
Platelets: 214 10*3/uL (ref 150–400)
RBC: 3.86 MIL/uL — ABNORMAL LOW (ref 3.87–5.11)
RBC: 3.95 MIL/uL (ref 3.87–5.11)
RDW: 14.7 % (ref 11.5–15.5)
RDW: 14.7 % (ref 11.5–15.5)
WBC: 12.8 10*3/uL — ABNORMAL HIGH (ref 4.0–10.5)
WBC: 16.6 10*3/uL — ABNORMAL HIGH (ref 4.0–10.5)

## 2014-12-07 LAB — TYPE AND SCREEN
ABO/RH(D): O POS
Antibody Screen: NEGATIVE

## 2014-12-07 LAB — ABO/RH: ABO/RH(D): O POS

## 2014-12-07 LAB — RPR: RPR Ser Ql: NONREACTIVE

## 2014-12-07 LAB — URIC ACID: Uric Acid, Serum: 3.7 mg/dL (ref 2.3–6.6)

## 2014-12-07 MED ORDER — BUTORPHANOL TARTRATE 1 MG/ML IJ SOLN
2.0000 mg | INTRAMUSCULAR | Status: DC | PRN
  Administered 2014-12-07: 2 mg via INTRAVENOUS
  Filled 2014-12-07: qty 2

## 2014-12-07 MED ORDER — OXYCODONE-ACETAMINOPHEN 5-325 MG PO TABS: 2.0000 | ORAL_TABLET | ORAL | Status: DC | PRN

## 2014-12-07 MED ORDER — FERROUS SULFATE 325 (65 FE) MG PO TABS
325.0000 mg | ORAL_TABLET | Freq: Two times a day (BID) | ORAL | Status: DC
  Filled 2014-12-07 (×2): qty 1

## 2014-12-07 MED ORDER — ONDANSETRON HCL 4 MG PO TABS: 4.0000 mg | ORAL_TABLET | ORAL | Status: DC | PRN

## 2014-12-07 MED ORDER — TERBUTALINE SULFATE 1 MG/ML IJ SOLN: 0.2500 mg | Freq: Once | INTRAMUSCULAR | Status: DC | PRN

## 2014-12-07 MED ORDER — ACETAMINOPHEN 325 MG PO TABS: 650.0000 mg | ORAL_TABLET | ORAL | Status: DC | PRN

## 2014-12-07 MED ORDER — ZOLPIDEM TARTRATE 5 MG PO TABS: 5.0000 mg | ORAL_TABLET | Freq: Every evening | ORAL | Status: DC | PRN

## 2014-12-07 MED ORDER — BENZOCAINE-MENTHOL 20-0.5 % EX AERO
1.0000 "application " | INHALATION_SPRAY | CUTANEOUS | Status: DC | PRN
  Administered 2014-12-07: 1 via TOPICAL
  Filled 2014-12-07: qty 56

## 2014-12-07 MED ORDER — OXYTOCIN 40 UNITS IN LACTATED RINGERS INFUSION - SIMPLE MED
62.5000 mL/h | INTRAVENOUS | Status: DC
  Administered 2014-12-07: 62.5 mL/h via INTRAVENOUS

## 2014-12-07 MED ORDER — PENICILLIN G POTASSIUM 5000000 UNITS IJ SOLR
5.0000 10*6.[IU] | Freq: Once | INTRAVENOUS | Status: AC
  Administered 2014-12-07: 5 10*6.[IU] via INTRAVENOUS
  Filled 2014-12-07: qty 5

## 2014-12-07 MED ORDER — LACTATED RINGERS IV SOLN: INTRAVENOUS | Status: DC

## 2014-12-07 MED ORDER — LACTATED RINGERS IV SOLN: 500.0000 mL | INTRAVENOUS | Status: DC | PRN

## 2014-12-07 MED ORDER — DIBUCAINE 1 % RE OINT: 1.0000 "application " | TOPICAL_OINTMENT | RECTAL | Status: DC | PRN

## 2014-12-07 MED ORDER — WITCH HAZEL-GLYCERIN EX PADS: 1.0000 "application " | MEDICATED_PAD | CUTANEOUS | Status: DC | PRN

## 2014-12-07 MED ORDER — PRENATAL MULTIVITAMIN CH
1.0000 | ORAL_TABLET | Freq: Every day | ORAL | Status: DC
  Administered 2014-12-08: 1 via ORAL
  Filled 2014-12-07: qty 1

## 2014-12-07 MED ORDER — OXYCODONE-ACETAMINOPHEN 5-325 MG PO TABS: 1.0000 | ORAL_TABLET | ORAL | Status: DC | PRN

## 2014-12-07 MED ORDER — MISOPROSTOL 25 MCG QUARTER TABLET
25.0000 ug | ORAL_TABLET | ORAL | Status: DC | PRN
  Administered 2014-12-07: 25 ug via VAGINAL
  Filled 2014-12-07: qty 0.25

## 2014-12-07 MED ORDER — IBUPROFEN 600 MG PO TABS
600.0000 mg | ORAL_TABLET | Freq: Four times a day (QID) | ORAL | Status: DC
  Administered 2014-12-07 – 2014-12-08 (×3): 600 mg via ORAL
  Filled 2014-12-07 (×3): qty 1

## 2014-12-07 MED ORDER — SIMETHICONE 80 MG PO CHEW: 80.0000 mg | CHEWABLE_TABLET | ORAL | Status: DC | PRN

## 2014-12-07 MED ORDER — OXYTOCIN 40 UNITS IN LACTATED RINGERS INFUSION - SIMPLE MED
1.0000 m[IU]/min | INTRAVENOUS | Status: DC
  Administered 2014-12-07: 2 m[IU]/min via INTRAVENOUS
  Filled 2014-12-07: qty 1000

## 2014-12-07 MED ORDER — LIDOCAINE HCL (PF) 1 % IJ SOLN: 30.0000 mL | INTRAMUSCULAR | Status: DC | PRN

## 2014-12-07 MED ORDER — LANOLIN HYDROUS EX OINT: TOPICAL_OINTMENT | CUTANEOUS | Status: DC | PRN

## 2014-12-07 MED ORDER — OXYTOCIN BOLUS FROM INFUSION: 500.0000 mL | INTRAVENOUS | Status: DC

## 2014-12-07 MED ORDER — ONDANSETRON HCL 4 MG/2ML IJ SOLN: 4.0000 mg | Freq: Four times a day (QID) | INTRAMUSCULAR | Status: DC | PRN

## 2014-12-07 MED ORDER — DIPHENHYDRAMINE HCL 25 MG PO CAPS: 25.0000 mg | ORAL_CAPSULE | Freq: Four times a day (QID) | ORAL | Status: DC | PRN

## 2014-12-07 MED ORDER — PENICILLIN G POTASSIUM 5000000 UNITS IJ SOLR
2.5000 10*6.[IU] | INTRAVENOUS | Status: DC
  Administered 2014-12-07 (×2): 2.5 10*6.[IU] via INTRAVENOUS
  Filled 2014-12-07 (×6): qty 2.5

## 2014-12-07 MED ORDER — SENNOSIDES-DOCUSATE SODIUM 8.6-50 MG PO TABS
2.0000 | ORAL_TABLET | ORAL | Status: DC
  Administered 2014-12-08: 2 via ORAL
  Filled 2014-12-07: qty 2

## 2014-12-07 MED ORDER — CITRIC ACID-SODIUM CITRATE 334-500 MG/5ML PO SOLN: 30.0000 mL | ORAL | Status: DC | PRN

## 2014-12-07 MED ORDER — ONDANSETRON HCL 4 MG/2ML IJ SOLN: 4.0000 mg | INTRAMUSCULAR | Status: DC | PRN

## 2014-12-07 NOTE — Progress Notes (Signed)
S: s/p cytotec Pitocin started@ 5 am  O: VE 2-3/70%/-2/-1  Tracing baseline 135 Ctx q 2-4 mins  IMP: Polyhydramnios P) cont with pitocin. Plans natural labor

## 2014-12-07 NOTE — H&P (Signed)
Toni Edwards is a 39 y.o. female presenting for IOL @ 39 4/[redacted] weeks gestation  2nd to polyhydramnios. . Maternal Medical History:  Fetal activity: Perceived fetal activity is normal.    Prenatal complications: Polyhydramnios.   Prenatal Complications - Diabetes: none.    OB History    Gravida Para Term Preterm AB TAB SAB Ectopic Multiple Living   3 2 2       2      Past Medical History  Diagnosis Date  . Fracture of lumbar vertebra 1995  . Scoliosis   . Breast nodule     by exam and [place] in July 2010 seen by Dr Donne Hazel, resolved on its own  . ADHD (attention deficit hyperactivity disorder)   . 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 Genital condyloma, female   . Headache     migraines   History reviewed. No pertinent past surgical history. Family History: family history includes Alcohol abuse in an other family member; Breast cancer in an other family member; Breast cancer (age of onset: 71) in her mother; Cancer in her maternal grandmother; Hyperlipidemia in an other family member; Migraines in her maternal aunt. Social History:  reports that she quit smoking about 8 months ago. Her smoking use included Cigarettes. She smoked 1.00 pack per day. She has never used smokeless tobacco. She reports that she does not drink alcohol or use illicit drugs.   Prenatal Transfer Tool  Maternal Diabetes: No Genetic Screening: Normal Maternal Ultrasounds/Referrals: Normal Fetal Ultrasounds or other Referrals:  None Maternal Substance Abuse:  No Significant Maternal Medications:  Meds include: Other: valtrex Significant Maternal Lab Results:  Lab values include: Group B Strep positive Other Comments:  polyhydramnios  Review of Systems  All other systems reviewed and are negative.   Dilation: 1 Effacement (%): 60 Station: -2 Exam by:: Leigha Tietje RN Blood pressure 126/66, pulse 68, temperature 98.4 F (36.9 C), temperature source Oral, resp. rate 18, height 5\' 4"  (1.626 m), weight 80.287 kg (177  lb), last menstrual period 03/05/2014. Exam Physical Exam  Constitutional: She is oriented to person, place, and time. She appears well-developed and well-nourished.  HENT:  Head: Atraumatic.  Eyes: EOM are normal.  Neck: Neck supple.  Cardiovascular: Regular rhythm.   Respiratory: Breath sounds normal.  GI: Soft.  Musculoskeletal: She exhibits no edema.  Neurological: She is alert and oriented to person, place, and time.  Skin: Skin is warm and dry.  Psychiatric: She has a normal mood and affect.    Prenatal labs: ABO, Rh: --/--/O POS, O POS (07/08 0030) Antibody: NEG (07/08 0030) Rubella: Immune (12/11 0000) RPR: Nonreactive (12/11 0000)  HBsAg: Negative (12/11 0000)  HIV: Non-reactive (12/11 0000)  GBS: Positive (06/06 0000)   Assessment/Plan: Polyhydramnios GBS cx positive Term gestation P) admit routine labs. Cytotec. Pitocin amniotomy prn analgesic prn   Deland Slocumb A 12/07/2014, 4:24 AM

## 2014-12-07 NOTE — Progress Notes (Signed)
S: c/o back pain Breathing with ctx SROM copious clear fluid  O: VE deferred  Tracing: baseline 130  Ctx q 2 mins  IMP: active phase Back pain P) pain med.  Offered sterile water papules. Epidural prn

## 2014-12-07 NOTE — Plan of Care (Signed)
Problem: Consults Goal: Birthing Suites Patient Information Press F2 to bring up selections list Outcome: Completed/Met Date Met:  12/07/14  Pt 37-[redacted] weeks EGA

## 2014-12-08 LAB — CBC
HEMATOCRIT: 32.5 % — AB (ref 36.0–46.0)
HEMOGLOBIN: 11 g/dL — AB (ref 12.0–15.0)
MCH: 31.2 pg (ref 26.0–34.0)
MCHC: 33.8 g/dL (ref 30.0–36.0)
MCV: 92.1 fL (ref 78.0–100.0)
Platelets: 213 10*3/uL (ref 150–400)
RBC: 3.53 MIL/uL — AB (ref 3.87–5.11)
RDW: 14.9 % (ref 11.5–15.5)
WBC: 15.6 10*3/uL — ABNORMAL HIGH (ref 4.0–10.5)

## 2014-12-08 MED ORDER — IBUPROFEN 800 MG PO TABS
800.0000 mg | ORAL_TABLET | Freq: Three times a day (TID) | ORAL | Status: DC
  Administered 2014-12-08: 800 mg via ORAL
  Filled 2014-12-08: qty 1

## 2014-12-08 MED ORDER — IBUPROFEN 800 MG PO TABS: 800.0000 mg | ORAL_TABLET | Freq: Three times a day (TID) | ORAL | Status: AC

## 2014-12-08 NOTE — Progress Notes (Signed)
MOB was referred for history of depression/anxiety.  Referral is screened out by Clinical Social Worker because none of the following criteria appear to apply: -History of anxiety/depression during this pregnancy, or of post-partum depression. - Diagnosis of anxiety and/or depression within last 3 years or -MOB's symptoms are currently being treated with medication and/or therapy.  CSW completed chart review. MOB with history of anxiety noted in 2011. No symptoms noted or documented since that time.  Please contact the Clinical Social Worker if needs arise or upon MOB request.   Lucita Ferrara, LCSW [phone]

## 2014-12-08 NOTE — Progress Notes (Signed)
PPD 1 SVD - no repair  S:  Reports feeling well - ready for early DC to home today             Tolerating po/ No nausea or vomiting             Bleeding is light             Pain controlled with motrin but still a little sore and crampy  / no narcotic med taken             Up ad lib / ambulatory / voiding QS  Newborn breast feeding  / Circumcision done  O:               VS: BP 110/65 mmHg  Pulse 54  Temp(Src) 98.3 F (36.8 C) (Oral)  Resp 20  Ht 5\' 4"  (1.626 m)  Wt 80.287 kg (177 lb)  BMI 30.37 kg/m2  LMP 03/05/2014  Breastfeeding? Unknown   LABS:              Recent Labs  12/07/14 1640 12/08/14 0535  WBC 16.6* 15.6*  HGB 12.0 11.0*  PLT 192 213               Blood type: --/--/O POS, O POS (07/08 0030)  Rubella: Immune (12/11 0000)                   tdap current               I&O: Intake/Output      07/08 0701 - 07/09 0700 07/09 0701 - 07/10 0700   Blood 234    Total Output 234     Net -234                     Physical Exam:             Alert and oriented X3  Abdomen: soft, non-tender, non-distended              Fundus: firm, non-tender, U-1  Perineum: intact  Lochia: light   Extremities: noedema, no calf pain or tenderness   A: PPD # 1   Doing well - stable status  P: Routine post partum orders  DC home - increase ibuprofen dose to 800mg  - ok Tylenol between doses PRN             WOB booklet - instructions reviewed  Artelia Laroche CNM, MSN, [place] 12/08/2014, 9:02 AM

## 2014-12-08 NOTE — Discharge Summary (Signed)
Obstetric Discharge Summary  Reason for Admission: induction of labor -polyhydramnios Prenatal Procedures: ultrasound Intrapartum Procedures: spontaneous vaginal delivery and GBS prophylaxis Postpartum Procedures: none Complications-Operative and Postpartum: none HEMOGLOBIN  Date Value Ref Range Status  12/08/2014 11.0* 12.0 - 15.0 g/dL Final   HCT  Date Value Ref Range Status  12/08/2014 32.5* 36.0 - 46.0 % Final    Physical Exam:  General: alert, cooperative and no distress Lochia: appropriate Uterine Fundus: firm Incision: healing well DVT Evaluation: No evidence of DVT seen on physical exam.  Discharge Diagnoses: Term Pregnancy-delivered  Discharge Information: Date: 12/08/2014 Activity: pelvic rest Diet: routine Medications: PNV, Ibuprofen and tylenol - plain prn Condition: stable Instructions: refer to practice specific booklet Discharge to: home Follow-up Information    Follow up with Toni Edwards A, MD. Schedule an appointment as soon as possible for a visit in 6 weeks.   Specialty:  Obstetrics and Gynecology   Contact information:   [address] Toni Edwards [place] [address] [phone]       Newborn Data: Live born female  Birth Weight: 7 lb 11.6 oz (3504 g) APGAR: 8, 9  Home with mother.  Toni Edwards 12/08/2014, 9:32 AM

## 2015-06-07 IMAGING — CT CT ABD-PELV W/ CM
2 of 4 series · 17 of 46 positions shown, 19 images · IV contrast (omnipaque)
Comparison: None.

CLINICAL DATA: Two months of left flank pain radiating to the back

EXAM:
CT ABDOMEN AND PELVIS WITH CONTRAST
TECHNIQUE: Multidetector CT imaging of the abdomen and pelvis was performed
using the standard protocol following bolus administration of
intravenous contrast.
CONTRAST:  100mL OMNIPAQUE IOHEXOL 300 MG/ML  SOLN

[Series 2: abd/ pel 5mm · axial · 0.65mm/px · z∈[-440,-35]mm · 14 of 89 slices shown, 16 images]
[im 4/89  soft-tissue]
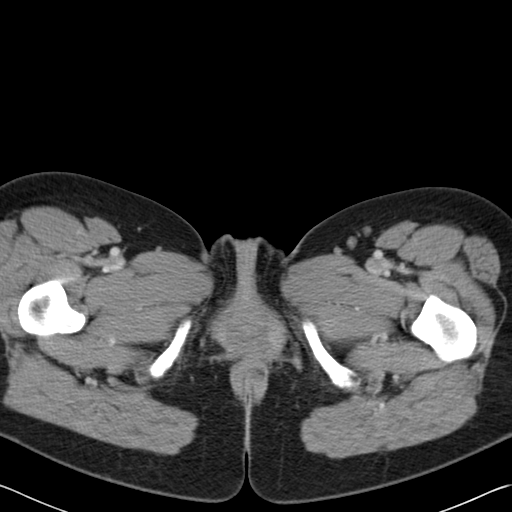
[im 4/89  bone]
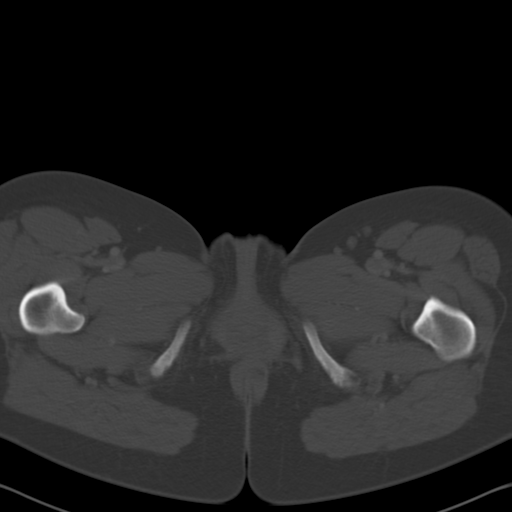
[im 12/89  soft-tissue]
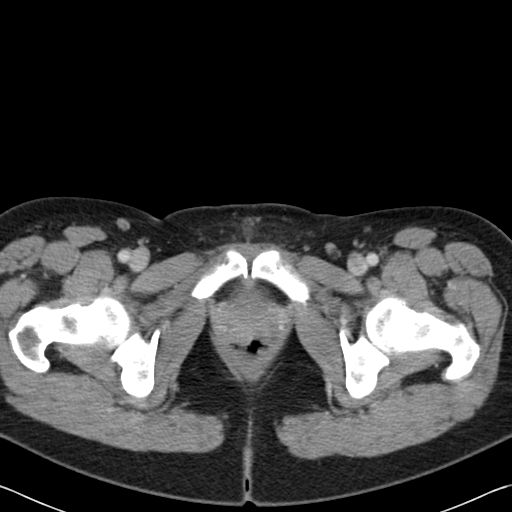
[im 19/89  soft-tissue]
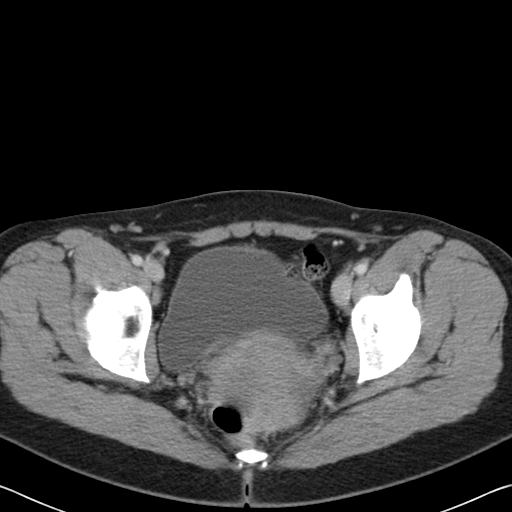
[im 23/89  soft-tissue]
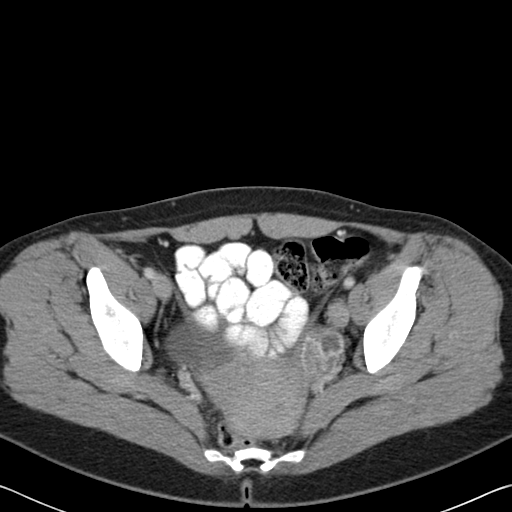
[im 30/89  soft-tissue]
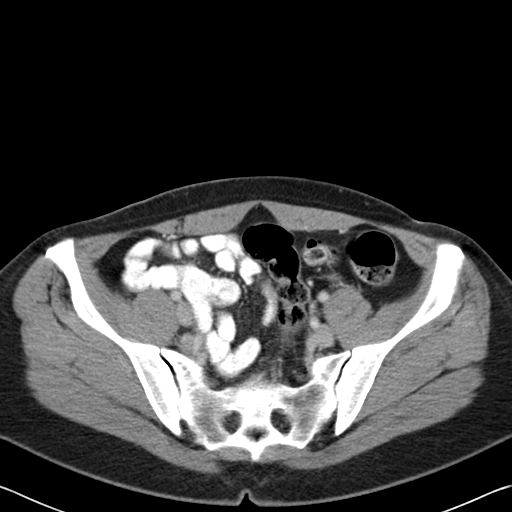
[im 37/89  soft-tissue]
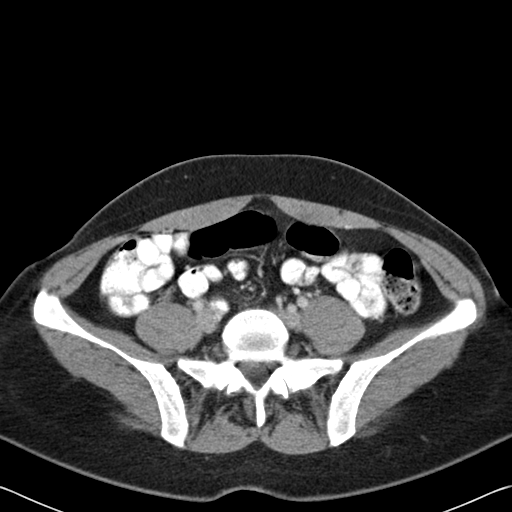
[im 41/89  soft-tissue]
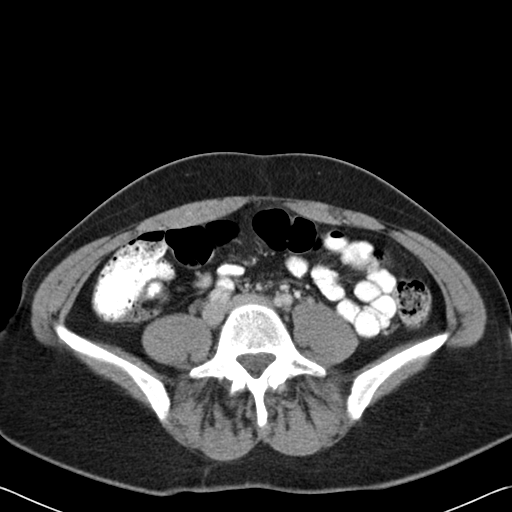
[im 48/89  soft-tissue]
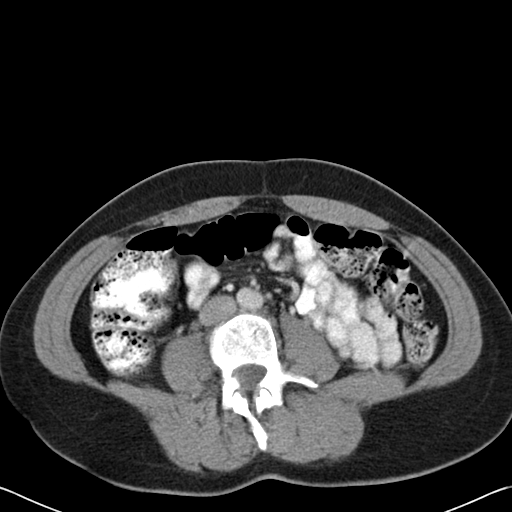
[im 52/89  soft-tissue]
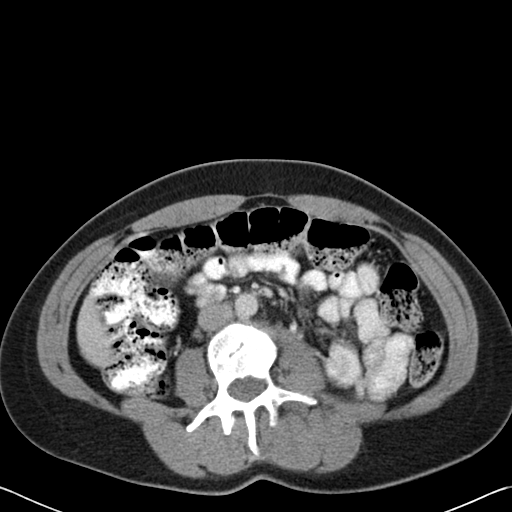
[im 52/89  bone]
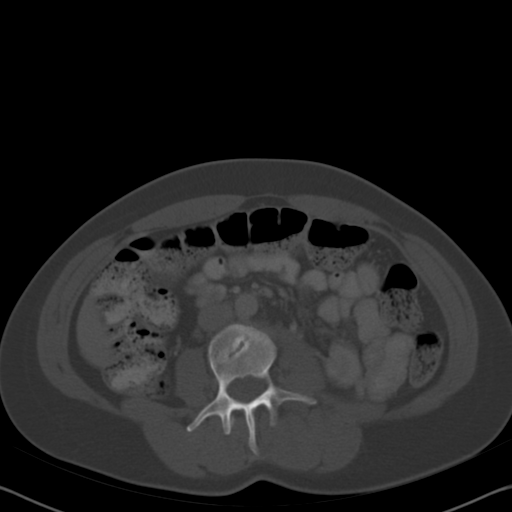
[im 59/89  soft-tissue]
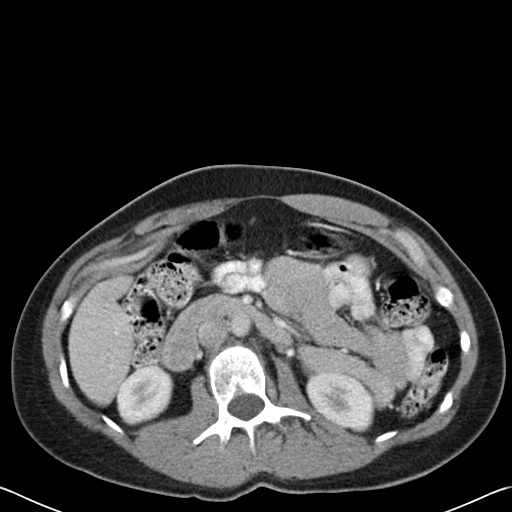
[im 67/89  soft-tissue]
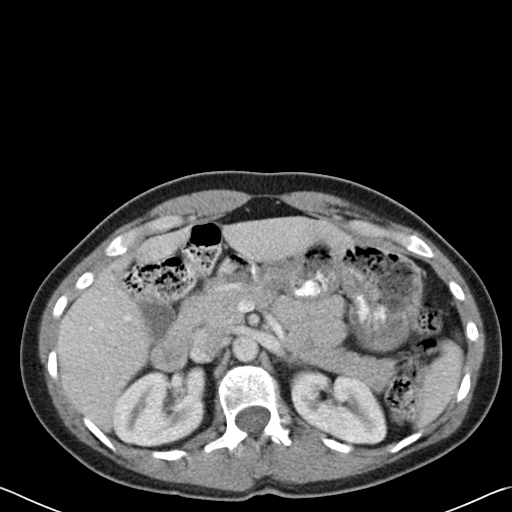
[im 70/89  soft-tissue]
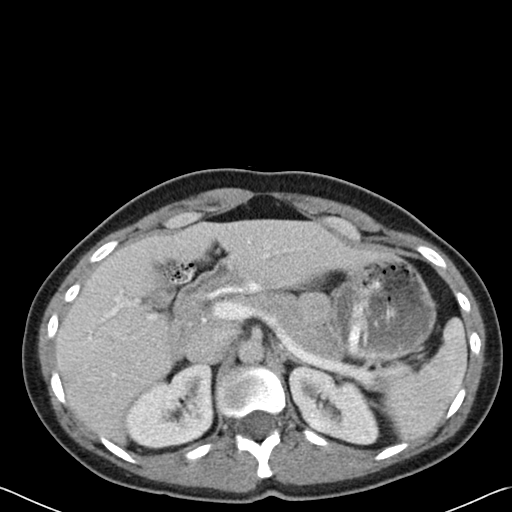
[im 78/89  soft-tissue]
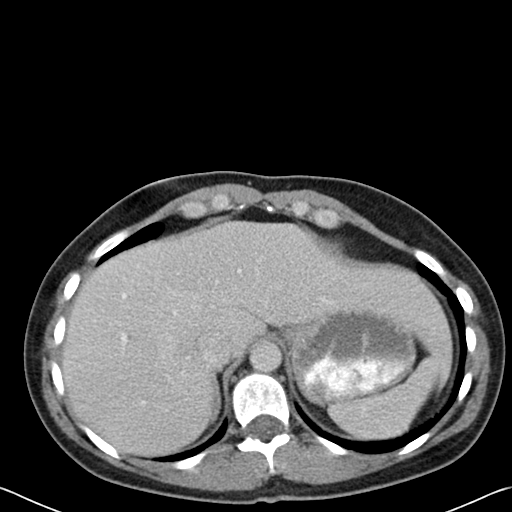
[im 85/89  soft-tissue]
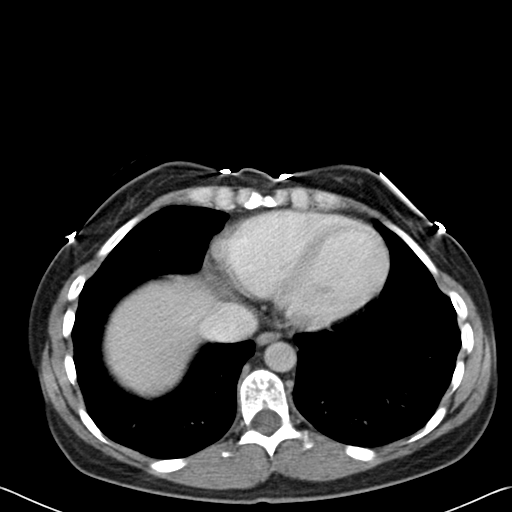

[Series 602: cor · coronal · 0.89mm/px · 3 of 97 slices shown]
[im 33/97  soft-tissue]
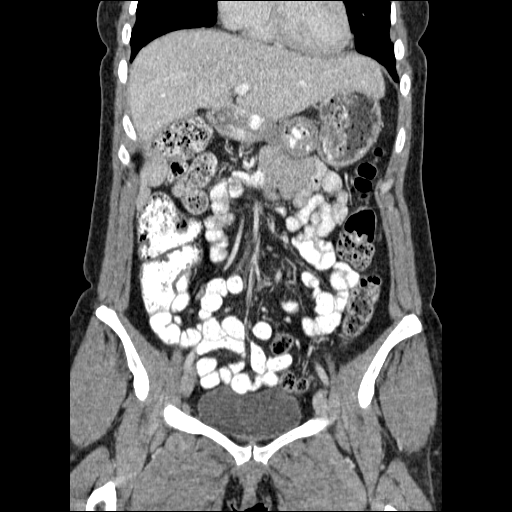
[im 43/97  soft-tissue]
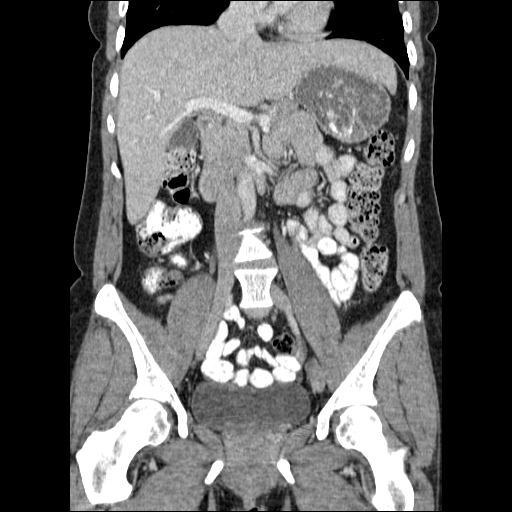
[im 54/97  soft-tissue]
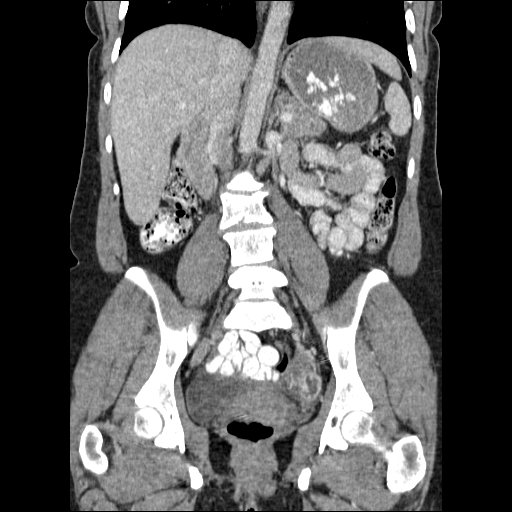

[17 of 46 positions shown; findings below may reference images not displayed]

FINDINGS: The lung bases are clear. The liver enhances with no focal
abnormality and no ductal dilatation is seen. No calcified
gallstones are noted. The pancreas is normal in size and the
pancreatic duct is not dilated. The adrenal glands and spleen are
unremarkable. The stomach is moderately fluid distended with no
abnormality noted. The kidneys enhance with no calculus or mass and
no hydronephrosis is seen. The abdominal aorta is normal in caliber.
No adenopathy is seen.

The uterus is normal in size, and there appears to be a collapsing
ovarian cyst on the left measuring 2.4 cm in diameter. Small right
ovarian follicles are present. No free fluid is noted within the
pelvis. The urinary bladder is unremarkable. There is feces
throughout the colon. The terminal ileum and the appendix are
unremarkable. There is a mild lumbar scoliosis convex to the right.
Degenerative change is present L2-3.
IMPRESSION: 1. The only abnormality noted which could explain the patient's left
flank and left lower quadrant pain is a collapsing left ovarian
cyst. No significant free fluid is seen within the pelvis.
2. The appendix and terminal ileum appear normal.

## 2015-06-19 ENCOUNTER — Other Ambulatory Visit: Payer: Self-pay | Admitting: Family Medicine

## 2015-10-23 ENCOUNTER — Encounter: Payer: Self-pay | Admitting: Family Medicine

## 2015-11-17 ENCOUNTER — Other Ambulatory Visit: Payer: Self-pay | Admitting: Family Medicine

## 2015-11-18 NOTE — Telephone Encounter (Signed)
Can we refill this? 

## 2016-10-05 ENCOUNTER — Other Ambulatory Visit: Payer: Self-pay

## 2016-10-05 NOTE — Telephone Encounter (Signed)
Patient last OV was 10/22/2016 and last labs are 12/08/2014. Patient is requesting for refill on her Etodolac 500 mg. Called patient to schedule appointment, left a voice message to return my call in the [facility]

## 2016-10-13 ENCOUNTER — Other Ambulatory Visit: Payer: Self-pay

## 2016-10-13 NOTE — Telephone Encounter (Signed)
Patient last seen in November of 2015. Patient requesting refill on Etodolac.  This medication 11/18/15 for #60 with 11 refills. Is this ok to refill or does patient need an office visit?

## 2017-02-04 ENCOUNTER — Telehealth: Payer: Self-pay | Admitting: Family Medicine

## 2017-02-04 NOTE — Telephone Encounter (Signed)
Yes I would be happy to see her again  

## 2017-02-04 NOTE — Telephone Encounter (Signed)
Pt last seen dr fry aug 2015. Pt would like to re-est. Can I sch?

## 2017-02-05 NOTE — Telephone Encounter (Signed)
lmom for pt to call back

## 2017-02-09 NOTE — Telephone Encounter (Signed)
lmom for pt to call back

## 2017-02-15 NOTE — Telephone Encounter (Signed)
lmom for pt to call back

## 2017-02-18 ENCOUNTER — Encounter: Payer: Self-pay | Admitting: Family Medicine

## 2017-02-19 NOTE — Telephone Encounter (Signed)
Pt has been sch

## 2017-03-15 ENCOUNTER — Ambulatory Visit (INDEPENDENT_AMBULATORY_CARE_PROVIDER_SITE_OTHER): Payer: 59 | Admitting: Family Medicine

## 2017-03-15 ENCOUNTER — Encounter: Payer: Self-pay | Admitting: Family Medicine

## 2017-03-15 VITALS — BP 113/86 | HR 49 | Temp 98.9°F | Ht 64.0 in | Wt 148.0 lb

## 2017-03-15 DIAGNOSIS — F900 Attention-deficit hyperactivity disorder, predominantly inattentive type: Secondary | ICD-10-CM

## 2017-03-15 MED ORDER — AMPHETAMINE-DEXTROAMPHET ER 10 MG PO CP24: 10.0000 mg | ORAL_CAPSULE | Freq: Every day | ORAL | 0 refills | Status: DC

## 2017-03-15 NOTE — Progress Notes (Signed)
   Subjective:    Patient ID: Toni Edwards, female    DOB: 02-Feb-1976, 41 y.o.   MRN: [id number]  HPI 41 yr old female to re-establish after an absence of over 3 years. She has been feeling well but wants to discuss her ADHD again. About 3 years ago she described to me her trouble with focus and with getting tasks done on time. She tried Strattera for 2 months but it gave her headaches so she stopped it. It never really helped her anyway. Now her job has more responsibility and she meets a lot people, and she finds it difficult to remember their names and her conversations with them.   Review of Systems  Constitutional: Negative.   Respiratory: Negative.   Cardiovascular: Negative.   Neurological: Negative.   Psychiatric/Behavioral: Positive for decreased concentration. Negative for agitation, confusion and dysphoric mood. The patient is not nervous/anxious.        Objective:   Physical Exam  Constitutional: She is oriented to person, place, and time. She appears well-developed and well-nourished.  Cardiovascular: Normal rate, regular rhythm, normal heart sounds and intact distal pulses.   Pulmonary/Chest: Effort normal and breath sounds normal.  Neurological: She is alert and oriented to person, place, and time.  Psychiatric: She has a normal mood and affect. Her behavior is normal. Thought content normal.          Assessment & Plan:  ADHD. We will try her on Adderall XR 10 mg each morning. Recheck in 2 weeks.  Alysia Penna, MD

## 2017-03-15 NOTE — Patient Instructions (Signed)
WE NOW OFFER   [facility] FAST TRACK!!!  SAME DAY Appointments for ACUTE CARE  Such as: Sprains, Injuries, cuts, abrasions, rashes, muscle pain, joint pain, back pain Colds, flu, sore throats, headache, allergies, cough, fever  Ear pain, sinus and eye infections Abdominal pain, nausea, vomiting, diarrhea, upset stomach Animal/insect bites  3 Easy Ways to Schedule: Walk-In Scheduling Call in scheduling Mychart Sign-up: [email or web address]         

## 2017-04-27 ENCOUNTER — Encounter: Payer: Self-pay | Admitting: Family Medicine

## 2017-04-27 ENCOUNTER — Ambulatory Visit: Payer: 59 | Admitting: Family Medicine

## 2017-04-27 VITALS — BP 110/62 | HR 53 | Temp 98.4°F | Wt 149.4 lb

## 2017-04-27 DIAGNOSIS — F900 Attention-deficit hyperactivity disorder, predominantly inattentive type: Secondary | ICD-10-CM | POA: Diagnosis not present

## 2017-04-27 MED ORDER — METHYLPHENIDATE HCL ER (LA) 20 MG PO CP24: 20.0000 mg | ORAL_CAPSULE | ORAL | 0 refills | Status: DC

## 2017-04-27 NOTE — Progress Notes (Signed)
   Subjective:    Patient ID: Toni Edwards, female    DOB: 1975/11/13, 41 y.o.   MRN: [id number]  HPI Here to follow up on ADHD. She has been trying Adderall XR 10 mg each morning, and it has been helping. She thinks a higher dose would work better. However every afternoon she has been getting headaches from it, and she has been taking Advil to help with these. No other side effects.    Review of Systems  Constitutional: Negative.   Cardiovascular: Negative.   Neurological: Positive for headaches.       Objective:   Physical Exam  Constitutional: She is oriented to person, place, and time. She appears well-developed and well-nourished.  Cardiovascular: Normal rate, regular rhythm, normal heart sounds and intact distal pulses.  Pulmonary/Chest: Effort normal and breath sounds normal. No respiratory distress. She has no wheezes. She has no rales.  Neurological: She is alert and oriented to person, place, and time.          Assessment & Plan:  ADHD. Instead of Adderall we will try Ritalin LA 20 mg daily. Recheck in 2 weeks.  Alysia Penna, MD

## 2017-06-07 DIAGNOSIS — N870 Mild cervical dysplasia: Secondary | ICD-10-CM | POA: Diagnosis not present

## 2017-06-07 DIAGNOSIS — Z01419 Encounter for gynecological examination (general) (routine) without abnormal findings: Secondary | ICD-10-CM | POA: Diagnosis not present

## 2018-01-17 ENCOUNTER — Other Ambulatory Visit: Payer: Self-pay | Admitting: Family Medicine

## 2018-01-17 ENCOUNTER — Encounter: Payer: Self-pay | Admitting: Family Medicine

## 2018-01-17 DIAGNOSIS — E785 Hyperlipidemia, unspecified: Secondary | ICD-10-CM

## 2018-01-17 NOTE — Telephone Encounter (Signed)
Tell that NO you cannot set up a lab appt online but she can call to set this up. I put in the orders.

## 2018-02-11 ENCOUNTER — Other Ambulatory Visit (INDEPENDENT_AMBULATORY_CARE_PROVIDER_SITE_OTHER): Payer: 59

## 2018-02-11 DIAGNOSIS — E785 Hyperlipidemia, unspecified: Secondary | ICD-10-CM

## 2018-02-11 LAB — POC URINALSYSI DIPSTICK (AUTOMATED)
Bilirubin, UA: NEGATIVE
Blood, UA: NEGATIVE
Glucose, UA: NEGATIVE
KETONES UA: NEGATIVE
Leukocytes, UA: NEGATIVE
Nitrite, UA: NEGATIVE
PROTEIN UA: NEGATIVE
Spec Grav, UA: 1.015 (ref 1.010–1.025)
UROBILINOGEN UA: 0.2 U/dL
pH, UA: 5.5 (ref 5.0–8.0)

## 2018-02-11 LAB — LIPID PANEL
CHOLESTEROL: 177 mg/dL (ref 0–200)
HDL: 49.8 mg/dL (ref >39.00)
LDL Cholesterol: 100 mg/dL — ABNORMAL HIGH (ref 0–99)
NonHDL: 127.13
Total CHOL/HDL Ratio: 4
Triglycerides: 136 mg/dL (ref 0.0–149.0)
VLDL: 27.2 mg/dL (ref 0.0–40.0)

## 2018-02-11 LAB — CBC WITH DIFFERENTIAL/PLATELET
BASOS ABS: 0.1 10*3/uL (ref 0.0–0.1)
BASOS PCT: 1.1 % (ref 0.0–3.0)
Eosinophils Absolute: 0.1 10*3/uL (ref 0.0–0.7)
Eosinophils Relative: 0.9 % (ref 0.0–5.0)
HEMATOCRIT: 38.8 % (ref 36.0–46.0)
Hemoglobin: 13.1 g/dL (ref 12.0–15.0)
Lymphocytes Relative: 37.2 % (ref 12.0–46.0)
Lymphs Abs: 3.6 10*3/uL (ref 0.7–4.0)
MCHC: 33.9 g/dL (ref 30.0–36.0)
MCV: 92.8 fl (ref 78.0–100.0)
MONOS PCT: 7.2 % (ref 3.0–12.0)
Monocytes Absolute: 0.7 10*3/uL (ref 0.1–1.0)
NEUTROS ABS: 5.1 10*3/uL (ref 1.4–7.7)
Neutrophils Relative %: 53.6 % (ref 43.0–77.0)
PLATELETS: 276 10*3/uL (ref 150.0–400.0)
RBC: 4.18 Mil/uL (ref 3.87–5.11)
RDW: 13.7 % (ref 11.5–15.5)
WBC: 9.5 10*3/uL (ref 4.0–10.5)

## 2018-02-11 LAB — BASIC METABOLIC PANEL
BUN: 14 mg/dL (ref 6–23)
CO2: 24 mEq/L (ref 19–32)
Calcium: 9.6 mg/dL (ref 8.4–10.5)
Chloride: 104 mEq/L (ref 96–112)
Creatinine, Ser: 0.9 mg/dL (ref 0.40–1.20)
GFR: 73.75 mL/min (ref >60.00)
Glucose, Bld: 79 mg/dL (ref 70–99)
POTASSIUM: 4 meq/L (ref 3.5–5.1)
Sodium: 138 mEq/L (ref 135–145)

## 2018-02-11 LAB — HEPATIC FUNCTION PANEL
ALT: 12 U/L (ref 0–35)
AST: 11 U/L (ref 0–37)
Albumin: 4.5 g/dL (ref 3.5–5.2)
Alkaline Phosphatase: 31 U/L — ABNORMAL LOW (ref 39–117)
BILIRUBIN DIRECT: 0.1 mg/dL (ref 0.0–0.3)
TOTAL PROTEIN: 6.9 g/dL (ref 6.0–8.3)
Total Bilirubin: 0.5 mg/dL (ref 0.2–1.2)

## 2018-02-11 LAB — TSH: TSH: 4.2 u[IU]/mL (ref 0.35–4.50)

## 2018-02-14 ENCOUNTER — Encounter: Payer: Self-pay | Admitting: *Deleted

## 2018-04-24 ENCOUNTER — Other Ambulatory Visit: Payer: Self-pay

## 2018-04-24 ENCOUNTER — Encounter (HOSPITAL_COMMUNITY): Payer: Self-pay | Admitting: Family Medicine

## 2018-04-24 ENCOUNTER — Ambulatory Visit (HOSPITAL_COMMUNITY)
Admission: EM | Admit: 2018-04-24 | Discharge: 2018-04-24 | Disposition: A | Discharge status: Home / Self Care | Payer: 59 | Attending: Family Medicine | Admitting: Family Medicine

## 2018-04-24 DIAGNOSIS — T1592XA Foreign body on external eye, part unspecified, left eye, initial encounter: Secondary | ICD-10-CM | POA: Diagnosis not present

## 2018-04-24 NOTE — ED Triage Notes (Signed)
Pt felt like she had something in her eye and she began to rub it . Now she feels like there something still in her eye. ( left eye ) x 3 days

## 2018-04-24 NOTE — ED Provider Notes (Signed)
[facility]    CSN: [id number] Arrival date & time: 04/24/18  1014     History   Chief Complaint Chief Complaint  Patient presents with  . Eye Problem    HPI JOVON STREETMAN is a 42 y.o. female.   This is the initial visit for this 42 year old woman.  She complains of foreign body sensation in her left eye since Friday.  She tried washing out the eye but this has not worked.     Past Medical History:  Diagnosis Date  . ADHD (attention deficit hyperactivity disorder)   . Breast nodule    by exam and [place] in July 2010 seen by Dr Donne Hazel, resolved on its own  . Fracture of lumbar vertebra ([place]) 1995  . Genital condyloma, female   . Headache    migraines  . 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 Postpartum care following vaginal delivery (7/8) 12/07/2014  . Scoliosis     Patient Active Problem List   Diagnosis Date Noted  . Labor and delivery indication for care or intervention 12/07/2014  . Postpartum care following vaginal delivery (7/8) 12/07/2014  . Attention deficit hyperactivity disorder (ADHD) 10/10/2009  . LOW BACK PAIN, CHRONIC 04/29/2009  . SCOLIOSIS 04/29/2009    History reviewed. No pertinent surgical history.  OB History    Gravida  3   Para  3   Term  3   Preterm      AB      Living  3     SAB      TAB      Ectopic      Multiple  0   Live Births  3            Home Medications    Prior to Admission medications   Medication Sig Start Date End Date Taking? Authorizing Provider  etodolac (LODINE) 500 MG tablet TAKE 1 TABLET BY MOUTH TWICE A DAY 11/18/15   Laurey Morale, MD  ibuprofen (ADVIL,MOTRIN) 800 MG tablet Take 1 tablet (800 mg total) by mouth every 8 (eight) hours. 12/08/14   Artelia Laroche, CNM  IUD'S IU by Intrauterine route.    [provider]  methylphenidate (RITALIN LA) 20 MG 24 hr capsule Take 1 capsule (20 mg total) by mouth every morning. 04/27/17   Laurey Morale, MD    Family History Family History  Problem  Relation Age of Onset  . Alcohol abuse Unknown   . Breast cancer Unknown   . Hyperlipidemia Unknown   . Breast cancer Mother 41  . Cancer Maternal Grandmother        breast  . Migraines Maternal Aunt     Social History Social History   Tobacco Use  . Smoking status: Former Smoker    Packs/day: 1.00    Types: Cigarettes    Last attempt to quit: 04/01/2014    Years since quitting: 4.0  . Smokeless tobacco: Never Used  Substance Use Topics  . Alcohol use: Yes    Alcohol/week: 0.0 standard drinks  . Drug use: No     Allergies   Patient has no known allergies.   Review of Systems Review of Systems   Physical Exam Triage Vital Signs ED Triage Vitals  Enc Vitals Group     BP      Pulse      Resp      Temp      Temp src      SpO2  Weight      Height      Head Circumference      Peak Flow      Pain Score      Pain Loc      Pain Edu?      Excl. in [place]?    No data found.  Updated Vital Signs BP 113/72 (BP Location: Right Arm)   Pulse 66   Temp 98.1 F (36.7 C) (Oral)   Resp 18   Wt 72.6 kg   LMP 04/10/2018   SpO2 99%   BMI 27.46 kg/m    Physical Exam  Constitutional: She is oriented to person, place, and time. She appears well-developed and well-nourished.  HENT:  Right Ear: External ear normal.  Left Ear: External ear normal.  Eyes: Conjunctivae are normal.  Left upper lid everted and foreign body identified and removed with complete resolution of symptoms  Pulmonary/Chest: Effort normal.  Musculoskeletal: Normal range of motion.  Neurological: She is alert and oriented to person, place, and time.  Skin: Skin is warm and dry.  Nursing note and vitals reviewed.    UC Treatments / Results  Labs (all labs ordered are listed, but only abnormal results are displayed) Labs Reviewed - No data to display  EKG None  Radiology No results found.  Procedures Procedures (including critical care time)  Medications Ordered in UC Medications  - No data to display  Initial Impression / Assessment and Plan / UC Course  I have reviewed the triage vital signs and the nursing notes.  Pertinent labs & imaging results that were available during my care of the patient were reviewed by me and considered in my medical decision making (see chart for details).    Final Clinical Impressions(s) / UC Diagnoses   Final diagnoses:  Foreign body of left eye, initial encounter   Discharge Instructions   None    ED Prescriptions    None     Controlled Substance Prescriptions [place] Controlled Substance Registry consulted? Not Applicable   Robyn Haber, MD 04/24/18 [phone]

## 2018-05-11 ENCOUNTER — Ambulatory Visit: Payer: 59 | Admitting: Family Medicine

## 2018-07-22 ENCOUNTER — Encounter: Payer: Self-pay | Admitting: Family Medicine

## 2018-07-22 ENCOUNTER — Ambulatory Visit: Payer: 59 | Admitting: Family Medicine

## 2018-07-22 VITALS — BP 110/70 | HR 49 | Temp 98.9°F | Wt 156.2 lb

## 2018-07-22 DIAGNOSIS — Z77120 Contact with and (suspected) exposure to mold (toxic): Secondary | ICD-10-CM

## 2018-07-22 DIAGNOSIS — G8929 Other chronic pain: Secondary | ICD-10-CM | POA: Diagnosis not present

## 2018-07-22 DIAGNOSIS — M1990 Unspecified osteoarthritis, unspecified site: Secondary | ICD-10-CM | POA: Diagnosis not present

## 2018-07-22 DIAGNOSIS — M545 Low back pain: Secondary | ICD-10-CM

## 2018-07-22 MED ORDER — DICLOFENAC SODIUM 75 MG PO TBEC: 75.0000 mg | DELAYED_RELEASE_TABLET | Freq: Two times a day (BID) | ORAL | 2 refills | Status: DC

## 2018-07-22 NOTE — Progress Notes (Signed)
   Subjective:    Patient ID: Toni Edwards, female    DOB: [birth year], 43 y.o.   MRN: [id number]  HPI Here for several issues. 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 She works for the [employer] and [employer]. So far she does not think these have caused her to have any symptoms. She also complains of joint pains that started several months ago. She has a long hx of low back pain, but now she also has pains in both feet, the right knee, the right elbow, and both shoulders. She has stiffness as well, but no swelling. She gets some relief by taking Ibuprofen but this does not last long. No significant family hx of arthritis.    Review of Systems  Constitutional: Negative.   Respiratory: Negative.   Cardiovascular: Negative.   Gastrointestinal: Negative.   Musculoskeletal: Positive for arthralgias.  Neurological: Negative.        Objective:   Physical Exam Constitutional:      Appearance: Normal appearance.  Cardiovascular:     Rate and Rhythm: Normal rate and regular rhythm.     Pulses: Normal pulses.     Heart sounds: Normal heart sounds.  Pulmonary:     Effort: Pulmonary effort is normal.     Breath sounds: Normal breath sounds.  Musculoskeletal:     Comments: I examined the joint areas above and these were unremarkable today, no tenderness or swelling. ROM is full  Neurological:     General: No focal deficit present.     Mental Status: She is alert and oriented to person, place, and time.           Assessment & Plan:  She has diffuse joint pains that are consistent with osteoarthritis. She can use Diclofenac as needed. Get labs today including ESR, CRP, and RF. I wrote a note so she will be allowed to wear athletic shoes to work.  Alysia Penna, MD

## 2018-07-23 LAB — CBC WITH DIFFERENTIAL/PLATELET
Absolute Monocytes: 781 cells/uL (ref 200–950)
Basophils Absolute: 85 cells/uL (ref 0–200)
Basophils Relative: 0.7 %
EOS ABS: 85 {cells}/uL (ref 15–500)
Eosinophils Relative: 0.7 %
HCT: 40 % (ref 35.0–45.0)
Hemoglobin: 13.5 g/dL (ref 11.7–15.5)
Lymphs Abs: 3465 cells/uL (ref 850–3900)
MCH: 31.3 pg (ref 27.0–33.0)
MCHC: 33.8 g/dL (ref 32.0–36.0)
MCV: 92.6 fL (ref 80.0–100.0)
MPV: 11.9 fL (ref 7.5–12.5)
Monocytes Relative: 6.4 %
NEUTROS PCT: 63.8 %
Neutro Abs: 7784 cells/uL (ref 1500–7800)
Platelets: 323 10*3/uL (ref 140–400)
RBC: 4.32 10*6/uL (ref 3.80–5.10)
RDW: 13 % (ref 11.0–15.0)
Total Lymphocyte: 28.4 %
WBC: 12.2 10*3/uL — ABNORMAL HIGH (ref 3.8–10.8)

## 2018-07-23 LAB — RHEUMATOID FACTOR: Rhuematoid fact SerPl-aCnc: <14 IU/mL (ref <14)

## 2018-07-23 LAB — SEDIMENTATION RATE: Sed Rate: 9 mm/h (ref 0–20)

## 2018-07-23 LAB — C-REACTIVE PROTEIN: CRP: 0.4 mg/L (ref <8.0)

## 2018-07-25 ENCOUNTER — Encounter: Payer: Self-pay | Admitting: *Deleted

## 2019-01-24 ENCOUNTER — Encounter: Payer: Self-pay | Admitting: Family Medicine

## 2019-01-25 NOTE — Telephone Encounter (Signed)
Set up an in person OV with me to reassess and decide what the best option is now

## 2019-02-22 ENCOUNTER — Encounter: Payer: Self-pay | Admitting: Family Medicine

## 2019-02-22 ENCOUNTER — Other Ambulatory Visit: Payer: Self-pay

## 2019-02-22 ENCOUNTER — Ambulatory Visit: Payer: 59 | Admitting: Family Medicine

## 2019-02-22 VITALS — BP 102/80 | HR 60 | Temp 97.9°F | Ht 64.0 in | Wt 161.6 lb

## 2019-02-22 DIAGNOSIS — F419 Anxiety disorder, unspecified: Secondary | ICD-10-CM | POA: Diagnosis not present

## 2019-02-22 MED ORDER — PAROXETINE HCL 10 MG PO TABS: 10.0000 mg | ORAL_TABLET | Freq: Every day | ORAL | 2 refills | Status: DC

## 2019-02-22 NOTE — Progress Notes (Signed)
   Subjective:    Patient ID: Toni Edwards, female    DOB: 1976/04/19, 43 y.o.   MRN: [id number]  HPI Here to help with anxiety. She says over the past year she has been dealing with a very difficult and critical boss at work. Toni Edwards feels like she is always looking for the slightest reason to write her up for some error or other. This makes Toni Edwards dread coming to work. She feels nervous all the time, and she has a hard time sleeping at night. She denies any depression symptoms. She notes that she took Paxil for 3 years from the age of 63 to 40, and it worked well for her at that time.   Review of Systems  Constitutional: Negative.   Respiratory: Negative.   Cardiovascular: Negative.   Neurological: Negative.   Psychiatric/Behavioral: Positive for sleep disturbance. Negative for agitation, behavioral problems, confusion, decreased concentration, dysphoric mood, self-injury and suicidal ideas. The patient is nervous/anxious.        Objective:   Physical Exam Constitutional:      Appearance: Normal appearance.  Cardiovascular:     Rate and Rhythm: Normal rate and regular rhythm.     Pulses: Normal pulses.     Heart sounds: Normal heart sounds.  Pulmonary:     Effort: Pulmonary effort is normal.     Breath sounds: Normal breath sounds.  Neurological:     Mental Status: She is alert.  Psychiatric:        Mood and Affect: Mood normal.        Behavior: Behavior normal.        Thought Content: Thought content normal.        Judgment: Judgment normal.           Assessment & Plan:  She has some generalized anxiety that stems from a toxic work environment. Try Paxil 10 mg daily. Recheck in 2-3 weeks.  Alysia Penna, MD

## 2019-05-20 ENCOUNTER — Other Ambulatory Visit: Payer: Self-pay | Admitting: Family Medicine

## 2019-05-28 ENCOUNTER — Other Ambulatory Visit: Payer: Self-pay | Admitting: Family Medicine

## 2019-08-01 ENCOUNTER — Encounter: Payer: Self-pay | Admitting: Family Medicine

## 2019-08-01 NOTE — Telephone Encounter (Signed)
Yes have her come in fasting and we can do it all on the same day

## 2019-08-03 ENCOUNTER — Other Ambulatory Visit: Payer: Self-pay

## 2019-08-04 ENCOUNTER — Encounter: Payer: Self-pay | Admitting: Family Medicine

## 2019-08-04 ENCOUNTER — Ambulatory Visit (INDEPENDENT_AMBULATORY_CARE_PROVIDER_SITE_OTHER): Payer: 59 | Admitting: Family Medicine

## 2019-08-04 VITALS — BP 122/68 | HR 59 | Temp 97.6°F | Wt 155.6 lb

## 2019-08-04 DIAGNOSIS — D171 Benign lipomatous neoplasm of skin and subcutaneous tissue of trunk: Secondary | ICD-10-CM

## 2019-08-04 DIAGNOSIS — E785 Hyperlipidemia, unspecified: Secondary | ICD-10-CM | POA: Diagnosis not present

## 2019-08-04 LAB — BASIC METABOLIC PANEL
BUN: 12 mg/dL (ref 6–23)
CO2: 28 mEq/L (ref 19–32)
Calcium: 9.6 mg/dL (ref 8.4–10.5)
Chloride: 102 mEq/L (ref 96–112)
Creatinine, Ser: 0.91 mg/dL (ref 0.40–1.20)
GFR: 68.01 mL/min (ref >60.00)
Glucose, Bld: 75 mg/dL (ref 70–99)
Potassium: 4 mEq/L (ref 3.5–5.1)
Sodium: 137 mEq/L (ref 135–145)

## 2019-08-04 LAB — LIPID PANEL
Cholesterol: 173 mg/dL (ref 0–200)
HDL: 56.3 mg/dL (ref >39.00)
LDL Cholesterol: 104 mg/dL — ABNORMAL HIGH (ref 0–99)
NonHDL: 116.67
Total CHOL/HDL Ratio: 3
Triglycerides: 61 mg/dL (ref 0.0–149.0)
VLDL: 12.2 mg/dL (ref 0.0–40.0)

## 2019-08-04 LAB — HEPATIC FUNCTION PANEL
ALT: 16 U/L (ref 0–35)
AST: 14 U/L (ref 0–37)
Albumin: 4.4 g/dL (ref 3.5–5.2)
Alkaline Phosphatase: 32 U/L — ABNORMAL LOW (ref 39–117)
Bilirubin, Direct: 0.1 mg/dL (ref 0.0–0.3)
Total Bilirubin: 0.5 mg/dL (ref 0.2–1.2)
Total Protein: 6.7 g/dL (ref 6.0–8.3)

## 2019-08-04 LAB — CBC WITH DIFFERENTIAL/PLATELET
Basophils Absolute: 0.1 10*3/uL (ref 0.0–0.1)
Basophils Relative: 1.3 % (ref 0.0–3.0)
Eosinophils Absolute: 0.1 10*3/uL (ref 0.0–0.7)
Eosinophils Relative: 0.9 % (ref 0.0–5.0)
HCT: 38.8 % (ref 36.0–46.0)
Hemoglobin: 13.1 g/dL (ref 12.0–15.0)
Lymphocytes Relative: 41.6 % (ref 12.0–46.0)
Lymphs Abs: 3 10*3/uL (ref 0.7–4.0)
MCHC: 33.7 g/dL (ref 30.0–36.0)
MCV: 95.2 fl (ref 78.0–100.0)
Monocytes Absolute: 0.5 10*3/uL (ref 0.1–1.0)
Monocytes Relative: 6.9 % (ref 3.0–12.0)
Neutro Abs: 3.6 10*3/uL (ref 1.4–7.7)
Neutrophils Relative %: 49.3 % (ref 43.0–77.0)
Platelets: 329 10*3/uL (ref 150.0–400.0)
RBC: 4.07 Mil/uL (ref 3.87–5.11)
RDW: 13.9 % (ref 11.5–15.5)
WBC: 7.2 10*3/uL (ref 4.0–10.5)

## 2019-08-04 LAB — TSH: TSH: 2.31 u[IU]/mL (ref 0.35–4.50)

## 2019-08-04 NOTE — Progress Notes (Signed)
   Subjective:    Patient ID: Toni Edwards, female    DOB: [birth year], 44 y.o.   MRN: [id number]  HPI Here to check a lump on the left side that she discovered about 4 days ago. It does not bother her at all.    Review of Systems  Constitutional: Negative.   Respiratory: Negative.   Cardiovascular: Negative.        Objective:   Physical Exam Constitutional:      Appearance: Normal appearance.  Cardiovascular:     Rate and Rhythm: Normal rate and regular rhythm.     Pulses: Normal pulses.     Heart sounds: Normal heart sounds.  Pulmonary:     Effort: Pulmonary effort is normal.     Breath sounds: Normal breath sounds.  Skin:    Comments: There is a firm mobile non-tender 2 cm lump under the skin on the left side just inferior to the axilla   Neurological:     Mental Status: She is alert.           Assessment & Plan:  Lipoma. She was reassured this is benign and does not require treatment.  Alysia Penna, MD

## 2019-08-18 ENCOUNTER — Other Ambulatory Visit: Payer: Self-pay | Admitting: Family Medicine

## 2019-08-18 DIAGNOSIS — Z1231 Encounter for screening mammogram for malignant neoplasm of breast: Secondary | ICD-10-CM

## 2019-08-30 ENCOUNTER — Encounter: Payer: 59 | Admitting: Family Medicine

## 2019-09-15 ENCOUNTER — Other Ambulatory Visit: Payer: Self-pay | Admitting: Family Medicine

## 2019-10-12 ENCOUNTER — Encounter: Payer: Self-pay | Admitting: Family Medicine

## 2019-10-12 MED ORDER — METHYLPREDNISOLONE 4 MG PO TBPK
ORAL_TABLET | ORAL | 0 refills | Status: DC
Start: 2019-10-12 — End: 2020-04-11

## 2019-10-12 NOTE — Telephone Encounter (Signed)
Call in a 4 mg Medrol dose pack. If that doesn't work have her come in to see me

## 2019-10-20 LAB — HM MAMMOGRAPHY

## 2019-10-26 ENCOUNTER — Encounter: Payer: Self-pay | Admitting: Family Medicine

## 2019-11-22 ENCOUNTER — Encounter: Payer: Self-pay | Admitting: Family Medicine

## 2019-11-22 MED ORDER — DICLOFENAC SODIUM 75 MG PO TBEC: 75.0000 mg | DELAYED_RELEASE_TABLET | Freq: Two times a day (BID) | ORAL | 0 refills | Status: DC

## 2020-01-14 ENCOUNTER — Other Ambulatory Visit: Payer: Self-pay | Admitting: Family Medicine

## 2020-01-29 ENCOUNTER — Other Ambulatory Visit: Payer: Self-pay

## 2020-01-30 ENCOUNTER — Ambulatory Visit: Payer: 59 | Admitting: Family Medicine

## 2020-04-08 ENCOUNTER — Ambulatory Visit: Payer: 59 | Admitting: Family Medicine

## 2020-04-11 ENCOUNTER — Encounter: Payer: Self-pay | Admitting: Family Medicine

## 2020-04-11 ENCOUNTER — Ambulatory Visit: Payer: 59 | Admitting: Family Medicine

## 2020-04-11 ENCOUNTER — Other Ambulatory Visit: Payer: Self-pay

## 2020-04-11 VITALS — BP 122/70 | HR 65 | Temp 98.1°F | Ht 64.0 in | Wt 152.4 lb

## 2020-04-11 DIAGNOSIS — M1990 Unspecified osteoarthritis, unspecified site: Secondary | ICD-10-CM | POA: Insufficient documentation

## 2020-04-11 DIAGNOSIS — M8949 Other hypertrophic osteoarthropathy, multiple sites: Secondary | ICD-10-CM | POA: Diagnosis not present

## 2020-04-11 DIAGNOSIS — M159 Polyosteoarthritis, unspecified: Secondary | ICD-10-CM

## 2020-04-11 MED ORDER — CYCLOBENZAPRINE HCL 10 MG PO TABS
10.0000 mg | ORAL_TABLET | Freq: Three times a day (TID) | ORAL | 5 refills | Status: DC | PRN
Start: 2020-04-11 — End: 2022-08-21

## 2020-04-11 MED ORDER — DICLOFENAC SODIUM 75 MG PO TBEC: 75.0000 mg | DELAYED_RELEASE_TABLET | Freq: Two times a day (BID) | ORAL | 11 refills | Status: DC

## 2020-04-11 NOTE — Progress Notes (Signed)
   Subjective:    Patient ID: Toni Edwards, female    DOB: 11-Jun-1975, 44 y.o.   MRN: [id number]  HPI Here for worsening stiffness and pain in the lower back, hips, and knees. She has scoliosis and osteoarthritis, and it seems to be advancing as she gets older. She has Diclofenac at home and it helps quite a bit, however she only takes this sporadically. She wants to exercise but the pain makes it difficult to even walk for long distances.    Review of Systems  Constitutional: Negative.   Respiratory: Negative.   Cardiovascular: Negative.   Musculoskeletal: Positive for arthralgias and back pain.       Objective:   Physical Exam Constitutional:      Appearance: Normal appearance.  Cardiovascular:     Rate and Rhythm: Normal rate and regular rhythm.     Pulses: Normal pulses.     Heart sounds: Normal heart sounds.  Pulmonary:     Effort: Pulmonary effort is normal.     Breath sounds: Normal breath sounds.  Neurological:     Mental Status: She is alert.           Assessment & Plan:  Osteoarthritis. I suggested she take the Diclofenac on a daily basis so she can keep ahead of the inflammation in her joints. Also she can try other forms of exercise such as yoga or swimming. Recheck prn. Alysia Penna, MD

## 2020-09-17 ENCOUNTER — Encounter: Payer: Self-pay | Admitting: Family Medicine

## 2020-09-18 NOTE — Telephone Encounter (Signed)
No, she is already on the maximum dosage of Diclofenac. Have her make another OV to discuss what to do next

## 2020-10-03 ENCOUNTER — Ambulatory Visit (INDEPENDENT_AMBULATORY_CARE_PROVIDER_SITE_OTHER): Payer: 59

## 2020-10-03 ENCOUNTER — Encounter: Payer: Self-pay | Admitting: Family Medicine

## 2020-10-03 ENCOUNTER — Other Ambulatory Visit: Payer: Self-pay

## 2020-10-03 ENCOUNTER — Ambulatory Visit: Payer: 59 | Admitting: Family Medicine

## 2020-10-03 VITALS — BP 108/70 | HR 56 | Temp 98.7°F | Wt 153.0 lb

## 2020-10-03 DIAGNOSIS — M545 Low back pain, unspecified: Secondary | ICD-10-CM

## 2020-10-03 DIAGNOSIS — G8929 Other chronic pain: Secondary | ICD-10-CM | POA: Diagnosis not present

## 2020-10-03 MED ORDER — CELECOXIB 200 MG PO CAPS: 200.0000 mg | ORAL_CAPSULE | Freq: Two times a day (BID) | ORAL | 5 refills | Status: DC

## 2020-10-03 NOTE — Progress Notes (Signed)
   Subjective:    Patient ID: Toni Edwards, female    DOB: 11-Aug-1975, 45 y.o.   MRN: [id number]  HPI Here for worsening low back pain. Her lower back has bothered her ever since she had a fracture in 1995. Over the past year the pain has become a little worse. It involves both sides of the lower back. It does not radiate into the legs. She takes Diclofenac twice every day and she still has to add Ibuprofen on some days to control the pain.    Review of Systems  Constitutional: Negative.   Respiratory: Negative.   Cardiovascular: Negative.   Musculoskeletal: Positive for back pain.       Objective:   Physical Exam Constitutional:      General: She is not in acute distress.    Appearance: Normal appearance.  Cardiovascular:     Rate and Rhythm: Normal rate and regular rhythm.     Pulses: Normal pulses.     Heart sounds: Normal heart sounds.  Pulmonary:     Effort: Pulmonary effort is normal.     Breath sounds: Normal breath sounds.  Musculoskeletal:     Comments: Mildly tender over the lower back, full ROM, negative SLR   Neurological:     Mental Status: She is alert.           Assessment & Plan:  Low back pain. We will stop the Diclofenac and try Celebrex 200 mg BID instead. We will refer her to PT. Get Xrays of the lumbar spine today. Alysia Penna, MD

## 2020-10-11 ENCOUNTER — Encounter: Payer: Self-pay | Admitting: Family Medicine

## 2020-10-11 NOTE — Telephone Encounter (Signed)
Yes she still has scoliosis in the lower back. I would call in moderate in degree (not severe). She also has the old healed fracture in L3, and some scattered arthritis. Yes I advise her to go ahead with the PT

## 2020-10-16 ENCOUNTER — Encounter: Payer: Self-pay | Admitting: Physical Therapy

## 2020-10-16 ENCOUNTER — Other Ambulatory Visit: Payer: Self-pay

## 2020-10-16 ENCOUNTER — Ambulatory Visit: Payer: 59 | Attending: Family Medicine | Admitting: Physical Therapy

## 2020-10-16 DIAGNOSIS — M6281 Muscle weakness (generalized): Secondary | ICD-10-CM | POA: Diagnosis present

## 2020-10-16 DIAGNOSIS — G8929 Other chronic pain: Secondary | ICD-10-CM | POA: Diagnosis present

## 2020-10-16 DIAGNOSIS — M545 Low back pain, unspecified: Secondary | ICD-10-CM | POA: Diagnosis present

## 2020-10-16 NOTE — Therapy (Signed)
[facility] [address], [place], [place], [address] Phone: [phone]   Fax:  [phone]  Physical Therapy Evaluation  Patient Details  Name: Toni Edwards MRN: [id number] Date of Birth: 1976-05-23 Referring Provider (PT): Laurey Morale, MD   Encounter Date: 10/16/2020   PT End of Session - 10/16/20 0752    Visit Number 1    Date for PT Re-Evaluation 12/11/20    Authorization Type UHC    Authorization Time Period through 05/31/21    Authorization - Visit Number 1    Authorization - Number of Visits 60    PT Start Time 0756    PT Stop Time 0845    PT Time Calculation (min) 49 min    Activity Tolerance Patient tolerated treatment well;No increased pain           Past Medical History:  Diagnosis Date  . ADHD (attention deficit hyperactivity disorder)   . Breast nodule    by exam and [place] in July 2010 seen by Dr Donne Hazel, resolved on its own  . Fracture of lumbar vertebra ([place]) 1995  . Genital condyloma, female   . Headache    migraines  . 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 Postpartum care following vaginal delivery (7/8) 12/07/2014  . Scoliosis     History reviewed. No pertinent surgical history.  There were no vitals filed for this visit.    Subjective Assessment - 10/16/20 0751    Subjective Pt referred to OPPT with ongoing chronic history of LBP.  She has history of L3 vertebral fracture in 1995 and Pt has had ongoing pain since then.  She also has scoliosis which MD feels is contributing to her pain.  Sciolosis was diagnosed in childhood.  Does [employer] work out videos and they flare her up.    Pertinent History lumbar spine Fx in 1995    Limitations Sitting    How long can you sit comfortably? 2-3 hours, works from home and can't find good chair    Diagnostic tests umbar xray: scoliosis of lumbar spine apex to Rt, concavity of superior endplate L3, stable, mild degenerative changes L2/3, L3/4    Patient  Stated Goals improved sitting tolerance, be able to be more active and work out, bending to pick up something    Currently in Pain? Yes    Pain Score 2    can reach 8/10   Pain Location Back    Pain Orientation Right;Left   Rt>>Lt   Pain Descriptors / Indicators Sharp;Constant;Dull;Aching    Pain Type Chronic pain    Pain Radiating Towards Rt side is constant/sharp, intermittent Lt side dull ache (back only, no leg symtoms)    Pain Onset More than a month ago    Pain Frequency Constant    Aggravating Factors  sitting, working out, bending    Pain Relieving Factors keep moving but not overdo it, maybe heat    Effect of Pain on Daily Activities work out, sit for working from home              [employer] PT Assessment - 10/16/20 0001      Assessment   Medical Diagnosis M54.50,G89.29 (ICD-10-CM) - Chronic bilateral low back pain without sciatica    Referring Provider (PT) Laurey Morale, MD    Onset Date/Surgical Date --   years ago   Next MD Visit as needed    Prior Therapy no      Precautions  Precautions None      Restrictions   Weight Bearing Restrictions No      Balance Screen   Has the patient fallen in the past 6 months No      [place] residence    Living Arrangements Spouse/significant other;Children    Type of [place] to enter    Entrance Stairs-Number of Steps 2    [place] One level      Prior Function   Level of Independence Independent    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    Leisure work out (videos at home), cook, arts/crafts      Observation/Other Assessments   Focus on Therapeutic Outcomes (FOTO)  57% goal 67%      Posture/Postural Control   Posture/Postural Control Postural limitations    Postural Limitations Right pelvic obliquity    Posture Comments Rt shoulder lower than Lt due to scoliosis      ROM / Strength   AROM / PROM / Strength  AROM;PROM;Strength      AROM   Overall AROM Comments trunk ROM WFL without pain      PROM   Overall PROM Comments hips full ROM all planes bil      Strength   Overall Strength Comments LE strength 5/5 bil, Rt lumbar multifidus not firing L5/S1 with palpable atrophy present      Flexibility   Soft Tissue Assessment /Muscle Length yes   hip flexors limited end range bil   Hamstrings limited end range bil    Quadriceps limited end range bil      Palpation   Spinal mobility atrophy present Rt L5/S1 multifidus, limited rib springing on Rt, limited gapping mid lumbar region on Lt    SI assessment  limited mobility Rt    Palpation comment TTP: Rt L5/S1 facet, mild tender Rt SI joint      Special Tests   Other special tests lumbar and hip special tests negative      Transfers   Transfers Independent with all Transfers      Ambulation/Gait   Gait Comments no gait deviations                      Objective measurements completed on examination: See above findings.       [facility] Adult PT Treatment/Exercise - 10/16/20 0001      Self-Care   Self-Care Other Self-Care Comments    Other Self-Care Comments  DN purpose, procedure, after-care instructions given            Trigger Point Dry Needling - 10/16/20 0001    Consent Given? Yes    Education Handout Provided Yes    Muscles Treated Back/Hip Lumbar multifidi    Dry Needling Comments Rt L4-S1, twitch L5/S1    Lumbar multifidi Response Twitch response elicited;Palpable increased muscle length                PT Education - 10/16/20 0838    Education Details DN info    Person(s) Educated Patient    Methods Handout    Comprehension Verbalized understanding            PT Short Term Goals - 10/16/20 0929      PT SHORT TERM GOAL #1   Title Pt will be ind with initial HEP    Time 3    Period Weeks  Status New    Target Date 11/06/20      PT SHORT TERM GOAL #2   Title Pt will be educated on sitting  posture for work station to improve sitting tolerance with intermittent breaks for work day without exacerbation of pain.    Time 4    Period Weeks    Status New    Target Date 11/13/20      PT SHORT TERM GOAL #3   Title Pt will be able to activate Rt L5/S1 deep mutlifidus with focused core ther ex for improved local stabilization.    Time 4    Period Weeks    Status New    Target Date 11/13/20             PT Long Term Goals - 10/16/20 1415      PT LONG TERM GOAL #1   Title Pt will be ind with advanced HEP and understand how to safely progress toward desired activities/work outs.    Time 8    Period Weeks    Status New    Target Date 12/11/20      PT LONG TERM GOAL #2   Title Pt will report improved sitting tolerance by at least 70% throughout work day using intermittent breaks to change positions as able.    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    Time 8    Period Weeks    Status New    Target Date 12/11/20      PT LONG TERM GOAL #4   Title FOTO improved from 57% to 67% to demo improved function.    Baseline 57%    Time 8    Period Weeks    Status New    Target Date 12/11/20                  Plan - 10/16/20 0925    Clinical Impression Statement Pt is a pleasant 45yo female with long history of chronic LBP since childhood.  She has scoliosis and Hx of an L3 fracture in 1995.  She has mild lumbar arthritic changes.  She has sharp, constant pain in Rt lumbar region and more intermittent Lt lumbar aching pain.  Pain is worse with sitting > 2 hours while working from home and with attempts to do [place] work out videos (jumping and high impact).  She presents with scoliosis with excellent mobility in trunk and LEs.  She has  mild flexibility restrictions at end range bil for hamstrings, hip flexors and quads.  Bil SI joints are mildly restricted.  She has atrophy of deep multifidus overlying L5/S1 facet on Rt which is where her palpable pain is.  She has limited gapping on Rt lumbar facets.  Rib springing is limited on Rt.  LE strength is 5/5 bil.  Pt performed DN to lumbar multifidi on Rt today with twitch and release.  Aftercare provided.  Pt will benefit from skilled PT for manual techniques and to develop HEP for targeted core strength and control, spinal mobility, LE flexibility and Pt education on return to aerobic/strength work outs and sitting posture for improved tolerance for work.  Personal Factors and Comorbidities Comorbidity 1    Comorbidities Hx of L3 fracture in 1995    Examination-Activity Limitations Sit;Bend;Lift;Other   jumping ther ex   Examination-Participation Restrictions Occupation;Meal Prep;Laundry;Community Activity;Cleaning    Stability/Clinical Decision Making Stable/Uncomplicated    Rehab Potential Excellent    PT Frequency 2x / week    PT Duration 8 weeks    PT Treatment/Interventions ADLs/Self Care Home Management;Spinal Manipulations;Joint Manipulations;Dry needling;Passive range of motion;Taping;Neuromuscular re-education;Therapeutic exercise;Therapeutic activities;Functional mobility training;Stair training;Electrical Stimulation;Moist Heat    PT Next Visit Plan f/u on DN #1 to Rt lumbar multif, start HEP for spine ROM/stretching (open books, child's pose with SB, quad and hip flexor stretch), start deep multif activation training, Pt education for sitting posture/work station set up, DN as indicated and manual to right ribcage    PT Home Exercise Plan DN aftercare info    Consulted and Agree with Plan of Care Patient           Patient will benefit from skilled therapeutic intervention in order to improve the following deficits and impairments:  Decreased range of  motion,Pain,Postural dysfunction,Decreased strength,Impaired flexibility,Hypomobility,Increased muscle spasms  Visit Diagnosis: Chronic bilateral low back pain without sciatica - Plan: PT plan of care cert/re-cert  Muscle weakness (generalized) - Plan: PT plan of care cert/re-cert     Problem List Patient Active Problem List   Diagnosis Date Noted  . Osteoarthritis 04/11/2020  . Attention deficit hyperactivity disorder (ADHD) 10/10/2009  . LOW BACK PAIN, CHRONIC 04/29/2009  . SCOLIOSIS 04/29/2009   Toni Edwards, PT 10/16/20 2:21 PM   [facility] [address], [place], [place], [address] Phone: [phone]   Fax:  [phone]  Name: Toni Edwards MRN: [id number] Date of Birth: April 20, 1976

## 2020-10-16 NOTE — Patient Instructions (Signed)

## 2020-10-18 ENCOUNTER — Ambulatory Visit: Payer: 59 | Admitting: Physical Therapy

## 2020-10-18 ENCOUNTER — Other Ambulatory Visit: Payer: Self-pay

## 2020-10-18 ENCOUNTER — Encounter: Payer: Self-pay | Admitting: Physical Therapy

## 2020-10-18 DIAGNOSIS — M545 Low back pain, unspecified: Secondary | ICD-10-CM | POA: Diagnosis not present

## 2020-10-18 DIAGNOSIS — M6281 Muscle weakness (generalized): Secondary | ICD-10-CM

## 2020-10-18 DIAGNOSIS — G8929 Other chronic pain: Secondary | ICD-10-CM

## 2020-10-18 NOTE — Patient Instructions (Signed)
Access Code: [email or web address] URL: [email or web address] Date: 10/18/2020 Prepared by: Venetia Night Camdon Saetern  Exercises Standing Quadriceps Stretch - 1 x daily - 7 x weekly - 1 sets - 2 reps - 20 hold Sidelying Thoracic Rotation with Open Book - 1 x daily - 7 x weekly - 1 sets - 5 reps Cat-Camel - 1 x daily - 7 x weekly - 1 sets - 10 reps - 2 hold Child's Pose Stretch - 1 x daily - 7 x weekly - 1 sets - 3 reps - 5 hold Child's Pose with Sidebending - 1 x daily - 7 x weekly - 1 sets - 3 reps - 5 hold Supine Hamstring Stretch with Strap - 1 x daily - 7 x weekly - 1 sets - 2 reps - 30 hold Supine Transversus Abdominis Bracing with Double Leg Fallout - 1 x daily - 7 x weekly - 1 sets - 10 reps Supine March with Alternating Leg Lifts - 1 x daily - 7 x weekly - 1 sets - 10 reps Supine Transversus Abdominis Bracing with Heel Slide - 1 x daily - 7 x weekly - 1 sets - 10 reps Supine Bridge with Mini Swiss Ball Between Knees - 1 x daily - 7 x weekly - 2 sets - 5 reps

## 2020-10-18 NOTE — Therapy (Signed)
[facility] [address] [place], [place], [address] Phone: [phone]   Fax:  [phone]  Physical Therapy Treatment  Patient Details  Name: Toni Edwards MRN: [id number] Date of Birth: 1976-05-13 Referring Provider (PT): Laurey Morale, MD   Encounter Date: 10/18/2020   PT End of Session - 10/18/20 0753    Visit Number 2    Date for PT Re-Evaluation 12/11/20    Authorization Type UHC    Authorization Time Period through 05/31/21    Authorization - Visit Number 2    Authorization - Number of Visits 60    PT Start Time 5701    PT Stop Time 7793    PT Time Calculation (min) 49 min    Activity Tolerance Patient tolerated treatment well;No increased pain           Past Medical History:  Diagnosis Date  . ADHD (attention deficit hyperactivity disorder)   . Breast nodule    by exam and [place] in July 2010 seen by Dr Donne Hazel, resolved on its own  . Fracture of lumbar vertebra ([place]) 1995  . Genital condyloma, female   . Headache    migraines  . 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 Postpartum care following vaginal delivery (7/8) 12/07/2014  . Scoliosis     History reviewed. No pertinent surgical history.  There were no vitals filed for this visit.   Subjective Assessment - 10/18/20 0752    Subjective No big change from evaluation.  I have low grade pain today on Lt side    Pertinent History lumbar spine Fx in 1995    Limitations Sitting    How long can you sit comfortably? 2-3 hours, works from home and can't find good chair    Diagnostic tests umbar xray: scoliosis of lumbar spine apex to Rt, concavity of superior endplate L3, stable, mild degenerative changes L2/3, L3/4    Patient Stated Goals improved sitting tolerance, be able to be more active and work out, bending to pick up something    Currently in Pain? Yes    Pain Score 1     Pain Location Back    Pain Orientation Left    Pain Descriptors / Indicators  Aching;Dull    Pain Type Chronic pain    Pain Onset More than a month ago    Pain Frequency Constant    Aggravating Factors  sitting, working out, bending    Pain Relieving Factors keep moving but not overdo, maybe heat    Effect of Pain on Daily Activities sit for work, work out                             [employer] Adult PT Treatment/Exercise - 10/18/20 0001      Neuro Re-ed    Neuro Re-ed Details  Pt education on deep core activation in supine hooklying, layered small march, bent knee fall out, heel slide x 10 each LE, finding pelvic neutral      Exercises   Exercises Lumbar;Knee/Hip;Shoulder      Lumbar Exercises: Stretches   Hip Flexor Stretch Left;Right;1 rep;30 seconds    Hip Flexor Stretch Limitations foot on first step    Quad Stretch 1 rep;Left;Right;20 seconds    Quad Stretch Limitations standing    Other Lumbar Stretch Exercise open book bil 1x5      Lumbar Exercises: Aerobic   Elliptical 2' L5 incline 5, PT cued  level head with more LE mobility vs bouncing      Lumbar Exercises: Supine   Bridge with [employer] 10 reps    Bridge with [employer] Limitations PT cued gentle ball squeeze and lift of pelvic floor via "pick up a blueberry" cue    Bridge with [employer] Limitations bridge initially painful without PF lift and added ball squeeze, then able to perform without pain      Lumbar Exercises: Quadruped   [place] 10 reps    Other Quadruped Lumbar Exercises child's pose 3x5" in each position (neutral, SB Rt, SB Lt)                  PT Education - 10/18/20 0839    Education Details Access Code: [email or web address]    Person(s) Educated Patient    Methods Explanation;Demonstration;Handout    Comprehension Verbalized understanding;Returned demonstration            PT Short Term Goals - 10/18/20 0900      PT SHORT TERM GOAL #1   Title Pt will be ind with initial HEP    Status On-going             PT Long Term Goals -  10/16/20 1415      PT LONG TERM GOAL #1   Title Pt will be ind with advanced HEP and understand how to safely progress toward desired activities/work outs.    Time 8    Period Weeks    Status New    Target Date 12/11/20      PT LONG TERM GOAL #2   Title Pt will report improved sitting tolerance by at least 70% throughout work day using intermittent breaks to change positions as able.    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    Time 8    Period Weeks    Status New    Target Date 12/11/20      PT LONG TERM GOAL #4   Title FOTO improved from 57% to 67% to demo improved function.    Baseline 57%    Time 8    Period Weeks    Status New    Target Date 12/11/20                 Plan - 10/18/20 0856    Clinical Impression Statement Pt arrived with 1/10 ache in Lt lumbar region.  She reports no big change with DN to Rt lumbar multifidus from evaluation, although she hasn't noticed pain there.  PT initiated HEP today with LE stretches, spine ROM, and supine positioning with deep core education and neuro re-ed for TA and PF activation with controlled LE motions.  Pt was unable to perform supine bridge without pain, but once she activated PF and added ball squeeze she was able to perform without pain.  PT discussed importance of deep core stability progression with goal of building larger muscle strength once ready.    Comorbidities Hx of L3 fracture in 1995    Rehab Potential Excellent    PT Frequency 2x / week    PT Duration 8 weeks    PT Treatment/Interventions ADLs/Self  Care Home Management;Spinal Manipulations;Joint Manipulations;Dry needling;Passive range of motion;Taping;Neuromuscular re-education;Therapeutic exercise;Therapeutic  activities;Functional mobility training;Stair training;Electrical Stimulation;Moist Heat    PT Next Visit Plan review HEP, sitting posture education, supine arms only deadbug with dumbbell, quadruped PF/TA plank, table plank on elbows with hip ext for glut max, sit to stand with ball squeeze holding weight, standing band pullbacks/forwards, modified dead lift, manual as indicated    PT Home Exercise Plan DN aftercare info    Consulted and Agree with Plan of Care Patient           Patient will benefit from skilled therapeutic intervention in order to improve the following deficits and impairments:     Visit Diagnosis: Chronic bilateral low back pain without sciatica  Muscle weakness (generalized)     Problem List Patient Active Problem List   Diagnosis Date Noted  . Osteoarthritis 04/11/2020  . Attention deficit hyperactivity disorder (ADHD) 10/10/2009  . LOW BACK PAIN, CHRONIC 04/29/2009  . SCOLIOSIS 04/29/2009    Baruch Merl, PT 10/18/20 9:02 AM   [facility] [address] [place], [place], [address] Phone: [phone]   Fax:  [phone]  Name: Toni Edwards MRN: [id number] Date of Birth: 10-Mar-1976

## 2020-10-25 ENCOUNTER — Other Ambulatory Visit: Payer: Self-pay

## 2020-10-25 ENCOUNTER — Encounter: Payer: Self-pay | Admitting: Physical Therapy

## 2020-10-25 ENCOUNTER — Ambulatory Visit: Payer: 59 | Admitting: Physical Therapy

## 2020-10-25 DIAGNOSIS — M545 Low back pain, unspecified: Secondary | ICD-10-CM

## 2020-10-25 DIAGNOSIS — M6281 Muscle weakness (generalized): Secondary | ICD-10-CM

## 2020-10-25 NOTE — Therapy (Signed)
[facility] [address] [place], [place], [address] Phone: [phone]   Fax:  [phone]  Physical Therapy Treatment  Patient Details  Name: Toni Edwards MRN: [id number] Date of Birth: 1975/08/29 Referring Provider (PT): Laurey Morale, MD   Encounter Date: 10/25/2020   PT End of Session - 10/25/20 0758    Visit Number 3    Date for PT Re-Evaluation 12/11/20    Authorization Type UHC    Authorization Time Period through 05/31/21    Authorization - Visit Number 3    Authorization - Number of Visits 60    PT Start Time 0800    PT Stop Time 0845    PT Time Calculation (min) 45 min    Activity Tolerance Patient tolerated treatment well;No increased pain    Behavior During Therapy WFL for tasks assessed/performed           Past Medical History:  Diagnosis Date  . ADHD (attention deficit hyperactivity disorder)   . Breast nodule    by exam and [place] in July 2010 seen by Dr Donne Hazel, resolved on its own  . Fracture of lumbar vertebra ([place]) 1995  . Genital condyloma, female   . Headache    migraines  . 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 Postpartum care following vaginal delivery (7/8) 12/07/2014  . Scoliosis     History reviewed. No pertinent surgical history.  There were no vitals filed for this visit.   Subjective Assessment - 10/25/20 0800    Subjective I'm having some pain in Rt low back today.  3-4/10.  I have done my HEP 3x since last here.  I don't feel a stretch in my Lt trunk when doing child's pose to the Rt.    Pertinent History lumbar spine Fx in 1995    Limitations Sitting    How long can you sit comfortably? 2-3 hours, works from home and can't find good chair    Diagnostic tests umbar xray: scoliosis of lumbar spine apex to Rt, concavity of superior endplate L3, stable, mild degenerative changes L2/3, L3/4    Patient Stated Goals improved sitting tolerance, be able to be more active and work out, bending  to pick up something    Currently in Pain? Yes    Pain Score 4     Pain Location Back    Pain Orientation Right;Lower    Pain Descriptors / Indicators Aching;Dull    Pain Type Chronic pain    Pain Onset More than a month ago    Pain Frequency Constant    Aggravating Factors  sitting, working out, bending    Pain Relieving Factors keep moving, not overdo, maybe heat    Effect of Pain on Daily Activities sit for work, work out                             [employer] Adult PT Treatment/Exercise - 10/25/20 0001      Self-Care   Self-Care Posture    Other Self-Care Comments  sitting for work, uses couch wiith legs propped on coffee table and laptop on lap.  Pain with transitions afterwards so PT suggested more frequent standing breaks every 30-45 min or use lumbar support pillow on/off      Neuro Re-ed    Neuro Re-ed Details  standing posture alignment VCs and TCs, add yellow band bil UE pullbacks/forwards 5x5" holds      Exercises  Exercises Lumbar;Knee/Hip;Shoulder      Lumbar Exercises: Stretches   Hip Flexor Stretch Left;Right;1 rep;30 seconds    Hip Flexor Stretch Limitations foot on 2nd step    Quad Stretch 1 rep;Left;Right;20 seconds    Quad Stretch Limitations standing      Lumbar Exercises: Aerobic   Elliptical 3' L5 incline L5 resistance, PT present to discuss status      Lumbar Exercises: Standing   Other Standing Lumbar Exercises diag dumbbell standing on black foam pad 2lb 1x10 Rt/Lt UE      Knee/Hip Exercises: Standing   Lateral Step Up Hand Hold: 2;Right;1 set;15 reps;Step Height: 2"    Forward Step Up Right;1 set;Hand Hold: 2;Step Height: 6";5 reps      Knee/Hip Exercises: Seated   Sit to Sand 10 reps;with UE support   mat table + black pad, PT demo'd hip hinge     Manual Therapy   Manual Therapy Soft tissue mobilization    Soft tissue mobilization Rt thoracic paraspinal broadening/stripping            Trigger Point Dry Needling - 10/25/20  0001    Consent Given? Yes    Muscles Treated Back/Hip Lumbar multifidi;Erector spinae    Dry Needling Comments Rt only, L3-S1, lower thoracic paraspinal threading    Erector spinae Response Twitch response elicited;Palpable increased muscle length    Lumbar multifidi Response Twitch response elicited;Palpable increased muscle length                  PT Short Term Goals - 10/25/20 0759      PT SHORT TERM GOAL #1   Title Pt will be ind with initial HEP    Status On-going      PT SHORT TERM GOAL #2   Title Pt will be educated on sitting posture for work station to improve sitting tolerance with intermittent breaks for work day without exacerbation of pain.    Status On-going      PT SHORT TERM GOAL #3   Title Pt will be able to activate Rt L5/S1 deep mutlifidus with focused core ther ex for improved local stabilization.    Status On-going             PT Long Term Goals - 10/16/20 1415      PT LONG TERM GOAL #1   Title Pt will be ind with advanced HEP and understand how to safely progress toward desired activities/work outs.    Time 8    Period Weeks    Status New    Target Date 12/11/20      PT LONG TERM GOAL #2   Title Pt will report improved sitting tolerance by at least 70% throughout work day using intermittent breaks to change positions as able.    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    Time 8    Period Weeks    Status New    Target Date 12/11/20      PT LONG TERM GOAL #4   Title FOTO improved from 57% to 67% to demo improved function.    Baseline 57%    Time 8    Period  Weeks    Status New    Target Date 12/11/20                 Plan - 10/25/20 0942    Clinical Impression Statement Pt reported she was  able to use her core while helping her husband move a tarp and was surprised not to feel increased pain following this.  She has been compliant with her HEP.  PT addressed Pt education for sitting posture, change of position and using lumbar support pillow or towel roll when seated for work.  PT also progressed body mechanics for sit to stand and functional squat, discussing breath control for exhale on effort.  Pt able to perform standing postural alignment with overlay of UE light band resistance and 2lb diagonal dumbell lift with improved awareness of core control today.  PT encouraged Pt to seek awareness during daily activities such as position transitions and light lifting and reach such as emptying dishwasher and doing laundry to continue functional tasks with more lumbar support.  PT revisited DN today with good release along Rt lumbar multifidi.  Continue along POC.    Comorbidities Hx of L3 fracture in 1995    PT Frequency 2x / week    PT Duration 8 weeks    PT Treatment/Interventions ADLs/Self Care Home Management;Spinal Manipulations;Joint Manipulations;Dry needling;Passive range of motion;Taping;Neuromuscular re-education;Therapeutic exercise;Therapeutic activities;Functional mobility training;Stair training;Electrical Stimulation;Moist Heat    PT Next Visit Plan f/u on sitting posture lumbar support and change of position, continue functional sit to stand/squat, standing diag dumbbell and band pulls fwd/bwd, glut strength on Rt    PT Home Exercise Plan Access Code: [email or web address]    Consulted and Agree with Plan of Care Patient           Patient will benefit from skilled therapeutic intervention in order to improve the following deficits and impairments:     Visit Diagnosis: Chronic bilateral low back pain without sciatica  Muscle weakness (generalized)     Problem List Patient Active Problem List   Diagnosis Date Noted  . Osteoarthritis 04/11/2020  . Attention deficit  hyperactivity disorder (ADHD) 10/10/2009  . LOW BACK PAIN, CHRONIC 04/29/2009  . SCOLIOSIS 04/29/2009    Baruch Merl, PT 10/25/20 9:49 AM   [facility] [address], [place], [place], [address] Phone: [phone]   Fax:  [phone]  Name: Toni Edwards MRN: [id number] Date of Birth: [birth year]

## 2020-10-29 ENCOUNTER — Encounter: Payer: Self-pay | Admitting: Physical Therapy

## 2020-10-29 ENCOUNTER — Other Ambulatory Visit: Payer: Self-pay

## 2020-10-29 ENCOUNTER — Ambulatory Visit: Payer: 59 | Admitting: Physical Therapy

## 2020-10-29 DIAGNOSIS — M6281 Muscle weakness (generalized): Secondary | ICD-10-CM

## 2020-10-29 DIAGNOSIS — M545 Low back pain, unspecified: Secondary | ICD-10-CM

## 2020-10-29 DIAGNOSIS — G8929 Other chronic pain: Secondary | ICD-10-CM

## 2020-10-29 NOTE — Patient Instructions (Signed)
Access Code: [email or web address] URL: [email or web address] Date: 10/29/2020 Prepared by: Venetia Night Gracelee Stemmler  Exercises Standing Quadriceps Stretch - 1 x daily - 7 x weekly - 1 sets - 2 reps - 20 hold Sidelying Thoracic Rotation with Open Book - 1 x daily - 7 x weekly - 1 sets - 5 reps Cat-Camel - 1 x daily - 7 x weekly - 1 sets - 10 reps - 2 hold Child's Pose Stretch - 1 x daily - 7 x weekly - 1 sets - 3 reps - 5 hold Child's Pose with Sidebending - 1 x daily - 7 x weekly - 1 sets - 3 reps - 5 hold Supine Hamstring Stretch with Strap - 1 x daily - 7 x weekly - 1 sets - 2 reps - 30 hold Supine Transversus Abdominis Bracing with Double Leg Fallout - 1 x daily - 7 x weekly - 1 sets - 10 reps Supine March with Alternating Leg Lifts - 1 x daily - 7 x weekly - 1 sets - 10 reps Supine Bridge with Mini Swiss Ball Between Knees - 1 x daily - 7 x weekly - 2 sets - 5 reps Supine Dead Bug with Leg Extension - 1 x daily - 7 x weekly - 1 sets - 10 reps Supine Shoulder Horizontal Abduction with Resistance - 1 x daily - 7 x weekly - 2 sets - 15 reps Supine PNF D2 Flexion with Resistance - 1 x daily - 7 x weekly - 2 sets - 10 reps Clamshell - 1 x daily - 7 x weekly - 2 sets - 5 reps Sidelying Hip Abduction - 1 x daily - 7 x weekly - 2 sets - 5 reps

## 2020-10-29 NOTE — Therapy (Signed)
[facility] [address] [place], [place], [address] Phone: [phone]   Fax:  [phone]  Physical Therapy Treatment  Patient Details  Name: Toni Edwards MRN: [id number] Date of Birth: [birth year] Referring Provider (PT): Laurey Morale, MD   Encounter Date: 10/29/2020   PT End of Session - 10/29/20 0759    Visit Number 4    Date for PT Re-Evaluation 12/11/20    Authorization Type UHC    Authorization Time Period through 05/31/21    Authorization - Visit Number 4    Authorization - Number of Visits 60    PT Start Time 0800    PT Stop Time 5916    PT Time Calculation (min) 47 min    Activity Tolerance Patient tolerated treatment well;No increased pain    Behavior During Therapy WFL for tasks assessed/performed           Past Medical History:  Diagnosis Date  . ADHD (attention deficit hyperactivity disorder)   . Breast nodule    by exam and [place] in July 2010 seen by Dr Donne Hazel, resolved on its own  . Fracture of lumbar vertebra ([place]) 1995  . Genital condyloma, female   . Headache    migraines  . 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 Postpartum care following vaginal delivery (7/8) 12/07/2014  . Scoliosis     History reviewed. No pertinent surgical history.  There were no vitals filed for this visit.   Subjective Assessment - 10/29/20 0758    Subjective I was sore for the rest of the day after DN then ok.  I don't feel like the DN changes my pain much.  I am mildly achey on both sides of my low back.    Pertinent History lumbar spine Fx in 1995    Limitations Sitting    How long can you sit comfortably? 2-3 hours, works from home and can't find good chair    Diagnostic tests umbar xray: scoliosis of lumbar spine apex to Rt, concavity of superior endplate L3, stable, mild degenerative changes L2/3, L3/4    Patient Stated Goals improved sitting tolerance, be able to be more active and work out, bending to pick up  something    Currently in Pain? Yes    Pain Score 2     Pain Location Back    Pain Orientation Right;Left;Lower    Pain Descriptors / Indicators Aching;Dull    Pain Type Chronic pain    Pain Onset More than a month ago    Pain Frequency Constant    Aggravating Factors  sitting, working out, bending    Pain Relieving Factors keep moving, no overdo    Effect of Pain on Daily Activities sit for work, exercise                             [facility] Adult PT Treatment/Exercise - 10/29/20 0001      Exercises   Exercises Lumbar;Knee/Hip;Shoulder      Lumbar Exercises: Aerobic   Elliptical L1 incline 10 x 5' PT present to review status      Lumbar Exercises: Supine   Heel Slides 5 reps    Heel Slides Limitations bil, before dead bugs    Dead Bug 10 reps    Dead Bug Limitations heel slide with opp UE 2lb,      Lumbar Exercises: Sidelying   Clam 10 reps    Clam Limitations  some discomfort in lumbar bil    Hip Abduction Both;5 reps    Hip Abduction Limitations fatigue w/ Rt >Lt      Knee/Hip Exercises: Stretches   Active Hamstring Stretch Left;Right;1 rep;30 seconds    Active Hamstring Stretch Limitations supine with strap    Quad Stretch Both;1 rep;30 seconds    Quad Stretch Limitations standing      Knee/Hip Exercises: Supine   Bridges with Ball Squeeze Strengthening;Both;1 set;10 reps   with blueberry pick up cue for PF     Shoulder Exercises: Supine   Horizontal ABduction Strengthening;Both;15 reps;Theraband    Theraband Level (Shoulder Horizontal ABduction) Level 3 (Green)    Diagonals Limitations D2 flexion x 10 Rt/Lt each, green tband      Shoulder Exercises: ROM/Strengthening   UBE (Upper Arm Bike) L1.5 2x2 fwd/bwd end of session for core/trunk awareness with UE work      Modalities   Modalities Moist Heat      Moist Heat Therapy   Number Minutes Moist Heat 8 Minutes    Moist Heat Location Lumbar Spine   end of session, concurrent with UBE initially  then supine                 PT Education - 10/29/20 0832    Education Details added UE green tband supine, SL clam and hip abd, dead bug progression from heel slide    Person(s) Educated Patient    Methods Explanation;Demonstration;Handout;Verbal cues    Comprehension Verbalized understanding;Returned demonstration            PT Short Term Goals - 10/25/20 0759      PT SHORT TERM GOAL #1   Title Pt will be ind with initial HEP    Status On-going      PT SHORT TERM GOAL #2   Title Pt will be educated on sitting posture for work station to improve sitting tolerance with intermittent breaks for work day without exacerbation of pain.    Status On-going      PT SHORT TERM GOAL #3   Title Pt will be able to activate Rt L5/S1 deep mutlifidus with focused core ther ex for improved local stabilization.    Status On-going             PT Long Term Goals - 10/16/20 1415      PT LONG TERM GOAL #1   Title Pt will be ind with advanced HEP and understand how to safely progress toward desired activities/work outs.    Time 8    Period Weeks    Status New    Target Date 12/11/20      PT LONG TERM GOAL #2   Title Pt will report improved sitting tolerance by at least 70% throughout work day using intermittent breaks to change positions as able.    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    Time 8    Period Weeks    Status New    Target Date 12/11/20      PT LONG TERM GOAL #4   Title FOTO improved from 57% to 67% to demo improved function.    Baseline 57%  Time 8    Period Weeks    Status New    Target Date 12/11/20                 Plan - 10/29/20 0839    Clinical Impression Statement Pt has not reported much change with DN.   She continues to have core, pelvic and hip weakness but is improving her awareness of how to activate and stabilize with UE/LE movement overlay.  PT progressed dead bug and added SL hip abd and clam, along with supine green band UE ther ex with good tolerance.  Pt arrived with low grade ache in bil lumbar region which improved on the Rt with session today but Lt side may have increased.  She has imbalance across her spine from scoliosis which likely contributes.  She is making progress toward her STGs.  Heat used end of session for soreness.  Continue along POC.    Comorbidities Hx of L3 fracture in 1995    Stability/Clinical Decision Making Stable/Uncomplicated    Rehab Potential Excellent    PT Frequency 2x / week    PT Duration 8 weeks    PT Treatment/Interventions ADLs/Self Care Home Management;Spinal Manipulations;Joint Manipulations;Dry needling;Passive range of motion;Taping;Neuromuscular re-education;Therapeutic exercise;Therapeutic activities;Functional mobility training;Stair training;Electrical Stimulation;Moist Heat    PT Next Visit Plan heat for soreness, f/u on updated HEP, continue functional strength, core/hip strength    PT Home Exercise Plan Access Code: [email or web address]    Consulted and Agree with Plan of Care Patient           Patient will benefit from skilled therapeutic intervention in order to improve the following deficits and impairments:     Visit Diagnosis: Chronic bilateral low back pain without sciatica  Muscle weakness (generalized)     Problem List Patient Active Problem List   Diagnosis Date Noted  . Osteoarthritis 04/11/2020  . Attention deficit hyperactivity disorder (ADHD) 10/10/2009  . LOW BACK PAIN, CHRONIC 04/29/2009  . SCOLIOSIS 04/29/2009    Baruch Merl, PT 10/29/20 8:45 AM   [facility] [address], [place], [place], [address] Phone: [phone]   Fax:  [phone]  Name:  Toni Edwards MRN: [id number] Date of Birth: September 01, 1975

## 2020-11-06 ENCOUNTER — Encounter: Payer: 59 | Admitting: Physical Therapy

## 2020-11-14 ENCOUNTER — Other Ambulatory Visit: Payer: Self-pay

## 2020-11-14 ENCOUNTER — Ambulatory Visit: Payer: 59 | Attending: Family Medicine | Admitting: Physical Therapy

## 2020-11-14 DIAGNOSIS — M6281 Muscle weakness (generalized): Secondary | ICD-10-CM | POA: Diagnosis present

## 2020-11-14 DIAGNOSIS — M545 Low back pain, unspecified: Secondary | ICD-10-CM | POA: Insufficient documentation

## 2020-11-14 DIAGNOSIS — G8929 Other chronic pain: Secondary | ICD-10-CM | POA: Diagnosis present

## 2020-11-14 NOTE — Therapy (Signed)
[facility] [address], [place], [place], [address] Phone: [phone]   Fax:  [phone]  Physical Therapy Treatment  Patient Details  Name: Toni Edwards MRN: [id number] Date of Birth: Jul 27, 1975 Referring Provider (PT): Laurey Morale, MD   Encounter Date: 11/14/2020   PT End of Session - 11/14/20 0845     Visit Number 5    Date for PT Re-Evaluation 12/11/20    Authorization Type UHC    Authorization Time Period through 05/31/21    Authorization - Visit Number 5    Authorization - Number of Visits 60    PT Start Time 0803    PT Stop Time 0841    PT Time Calculation (min) 38 min    Activity Tolerance Patient tolerated treatment well;No increased pain    Behavior During Therapy [facility] for tasks assessed/performed             Past Medical History:  Diagnosis Date   ADHD (attention deficit hyperactivity disorder)    Breast nodule    by exam and [place] in July 2010 seen by Dr Donne Hazel, resolved on its own   Fracture of lumbar vertebra ([place]) 1995   Genital condyloma, female    Headache    migraines   HSV-2 infection 1/14   Postpartum care following vaginal delivery (7/8) 12/07/2014   Scoliosis     No past surgical history on file.  There were no vitals filed for this visit.   Subjective Assessment - 11/14/20 0843     Subjective Has been partially compliant with HEP. Would like exercises to be reprinted.    Pertinent History lumbar spine Fx in 1995    Limitations Sitting    How long can you sit comfortably? 2-3 hours, works from home and can't find good chair    Diagnostic tests umbar xray: scoliosis of lumbar spine apex to Rt, concavity of superior endplate L3, stable, mild degenerative changes L2/3, L3/4    Patient Stated Goals improved sitting tolerance, be able to be more active and work out, bending to pick up something    Currently in Pain? Yes    Pain Score 2     Pain Location Back    Pain  Orientation Left    Pain Descriptors / Indicators Dull;Aching    Pain Onset More than a month ago                               [facility] Adult PT Treatment/Exercise - 11/14/20 0001       Lumbar Exercises: Aerobic   Elliptical L1 incline 10 x 5' PT present to discuss progress and assess response to activity      Lumbar Exercises: Supine   Heel Slides 5 reps    Heel Slides Limitations bil, before dead bugs    Dead Bug 10 reps    Dead Bug Limitations heel slide with opp UE 2lb,    Bridge with [employer] 10 reps    Bridge with [employer] Limitations cues for increased heel proximity to buttocks for decreased strain and glute pre-contraction prior to lift off      Lumbar Exercises: Sidelying   Clam Right;Left;10 reps    Hip Abduction Both;10 reps    Hip Abduction Limitations cues for decreased hip flexion and LE external rotation      Knee/Hip Exercises: Stretches   Active Hamstring Stretch Left;Right;1 rep;30 seconds  Active Hamstring Stretch Limitations supine with strap    Quad Stretch Both;1 rep;30 seconds    Quad Stretch Limitations standing      Shoulder Exercises: Supine   Horizontal ABduction Strengthening;Both;15 reps;Theraband    Theraband Level (Shoulder Horizontal ABduction) Level 3 (Green)    Diagonals Limitations D2 flexion x 10 Rt/Lt each, green tband      Shoulder Exercises: ROM/Strengthening   UBE (Upper Arm Bike) L1.5 2x2 fwd/bwd end of session for core/trunk awareness with UE work                      PT Short Term Goals - 11/14/20 0845       PT SHORT TERM GOAL #3   Title Pt will be able to activate Rt L5/S1 deep mutlifidus with focused core ther ex for improved local stabilization.    Time 4    Period Weeks    Status On-going    Target Date 11/13/20               PT Long Term Goals - 10/16/20 1415       PT LONG TERM GOAL #1   Title Pt will be ind with advanced HEP and understand how to safely progress  toward desired activities/work outs.    Time 8    Period Weeks    Status New    Target Date 12/11/20      PT LONG TERM GOAL #2   Title Pt will report improved sitting tolerance by at least 70% throughout work day using intermittent breaks to change positions as able.    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    Time 8    Period Weeks    Status New    Target Date 12/11/20      PT LONG TERM GOAL #4   Title FOTO improved from 57% to 67% to demo improved function.    Baseline 57%    Time 8    Period Weeks    Status New    Target Date 12/11/20                   Plan - 11/14/20 0844     Clinical Impression Statement Patient demonstrates improved glute strength as she completed increased repetitions of clamshell and hip abduction exercise without increased pain. Requiring verbal cues for proper glute activation when performing bridging exercise. HEP reprinted as patient reporting only partial compliance due to not having copy of exercises. Would benefit from continued skilled intervention to address impairments for decreased pain and improved activity tolerance.    Personal Factors and Comorbidities Comorbidity 1    Comorbidities Hx of L3 fracture in 1995    Examination-Activity Limitations Sit;Bend;Lift;Other    Examination-Participation Restrictions Occupation;Meal Prep;Laundry;Community Activity;Cleaning    Rehab Potential Excellent    PT Frequency 2x / week    PT Duration 8 weeks    PT Treatment/Interventions ADLs/Self Care Home Management;Spinal Manipulations;Joint Manipulations;Dry needling;Passive range of motion;Taping;Neuromuscular re-education;Therapeutic exercise;Therapeutic activities;Functional mobility training;Stair  training;Electrical Stimulation;Moist Heat    PT Next Visit Plan continue functional strength and core/hip strengthening    PT Home Exercise Plan Access  Code: [email or web address]    Consulted and Agree with Plan of Care Patient             Patient will benefit from skilled therapeutic intervention in order to improve the following deficits and impairments:  Decreased range of motion, Pain, Postural dysfunction, Decreased strength, Impaired flexibility, Hypomobility, Increased muscle spasms  Visit Diagnosis: Chronic bilateral low back pain without sciatica     Problem List Patient Active Problem List   Diagnosis Date Noted   Osteoarthritis 04/11/2020   Attention deficit hyperactivity disorder (ADHD) 10/10/2009   LOW BACK PAIN, CHRONIC 04/29/2009   SCOLIOSIS 04/29/2009    Everardo All PT, DPT  11/14/20 9:30 AM     [facility] [address] [place], [place], [address] Phone: [phone]   Fax:  [phone]  Name: Toni Edwards MRN: [id number] Date of Birth: 03-26-1976

## 2020-11-19 ENCOUNTER — Ambulatory Visit: Payer: 59 | Admitting: Physical Therapy

## 2020-11-19 ENCOUNTER — Encounter: Payer: Self-pay | Admitting: Physical Therapy

## 2020-11-19 ENCOUNTER — Other Ambulatory Visit: Payer: Self-pay

## 2020-11-19 DIAGNOSIS — G8929 Other chronic pain: Secondary | ICD-10-CM

## 2020-11-19 DIAGNOSIS — M6281 Muscle weakness (generalized): Secondary | ICD-10-CM

## 2020-11-19 DIAGNOSIS — M545 Low back pain, unspecified: Secondary | ICD-10-CM

## 2020-11-19 NOTE — Therapy (Signed)
[facility] [address] [place], [place], [address] Phone: [phone]   Fax:  [phone]  Physical Therapy Treatment  Patient Details  Name: Toni Edwards MRN: [id number] Date of Birth: 21-Aug-1975 Referring Provider (PT): Laurey Morale, MD   Encounter Date: 11/19/2020   PT End of Session - 11/19/20 0756     Visit Number 6    Date for PT Re-Evaluation 12/11/20    Authorization Type UHC    Authorization Time Period through 05/31/21    Authorization - Visit Number 6    Authorization - Number of Visits 60    PT Start Time 8341    PT Stop Time 9622    PT Time Calculation (min) 46 min    Activity Tolerance Patient tolerated treatment well;No increased pain    Behavior During Therapy [facility] for tasks assessed/performed             Past Medical History:  Diagnosis Date   ADHD (attention deficit hyperactivity disorder)    Breast nodule    by exam and [place] in July 2010 seen by Dr Donne Hazel, resolved on its own   Fracture of lumbar vertebra [facility]) 1995   Genital condyloma, female    Headache    migraines   HSV-2 infection 1/14   Postpartum care following vaginal delivery (7/8) 12/07/2014   Scoliosis     History reviewed. No pertinent surgical history.  There were no vitals filed for this visit.   Subjective Assessment - 11/19/20 0757     Subjective I did some yard work over the weekend and got into pain afterwards.  Pain comes on after sitting and then needing to stand again.  Usually recovers by the next day.  Sitting for work is better due to getting up more often and using more pillow support in sitting.    Pertinent History lumbar spine Fx in 1995    Limitations Sitting    How long can you sit comfortably? 2-3 hours, works from home and can't find good chair    Diagnostic tests umbar xray: scoliosis of lumbar spine apex to Rt, concavity of superior endplate L3, stable, mild degenerative changes L2/3, L3/4     Patient Stated Goals improved sitting tolerance, be able to be more active and work out, bending to pick up something    Currently in Pain? Yes    Pain Score 3     Pain Location Back    Pain Orientation Right;Left    Pain Descriptors / Indicators Aching;Dull;Tightness    Pain Type Chronic pain    Pain Onset More than a month ago    Pain Frequency Intermittent    Aggravating Factors  yard work, sitting is better due to getting up more often and using more pillow support in sitting    Pain Relieving Factors keep moving, not overdo                               [facility] Adult PT Treatment/Exercise - 11/19/20 0001       Self-Care   Self-Care Other Self-Care Comments    Other Self-Care Comments  Pt education on contractile and non-contractile components that contribute to joint stability of spine, pelvis, hips      Exercises   Exercises Lumbar;Knee/Hip;Shoulder      Lumbar Exercises: Aerobic   Elliptical L5 incline 5 x 5' PT present to discuss pain pattern  Lumbar Exercises: Supine   Bridge with [employer] 10 reps    Bridge with [employer] Limitations cues for increased heel proximity to buttocks for decreased strain and glute pre-contraction prior to lift off    Single Leg Bridge 5 reps   fig 4 bridge     Lumbar Exercises: Quadruped   Madcat/Old Horse 10 reps    Madcat/Old Horse Limitations Mad Cat to child's pose last rep x 30 sec      Knee/Hip Exercises: Stretches   Piriformis Stretch Both;1 rep;30 seconds    Piriformis Stretch Limitations seated fig 4      Knee/Hip Exercises: Standing   Side Lunges Both;1 set;5 reps   slider   Side Lunges Limitations and backward slider lunge 1x5    Lateral Step Up Left;Right;1 set;5 reps;Step Height: 4";Hand Hold: 0    Lateral Step Up Limitations runners    Forward Step Up Left;Right;1 set;Hand Hold: 0;5 reps    Forward Step Up Limitations runners    Rebounder lateral and stagger stance bounding 1' each   VC for  PF lift for bladder support   Other Standing Knee Exercises monster walks fwd/bwd x 2 cycles black loop band around knees      Knee/Hip Exercises: Sidelying   Hip ABduction Strengthening;Both;1 set;10 reps      Shoulder Exercises: Power Tower   Extension 20 reps    Extension Limitations 25lb from flexion to hands to hips for core    Flexion 20 reps    Flexion Limitations 20lb small range for lumbar multifidi                      PT Short Term Goals - 11/19/20 0855       PT SHORT TERM GOAL #1   Title Pt will be ind with initial HEP    Status Achieved      PT SHORT TERM GOAL #2   Title Pt will be educated on sitting posture for work station to improve sitting tolerance with intermittent breaks for work day without exacerbation of pain.    Status Achieved      PT SHORT TERM GOAL #3   Title Pt will be able to activate Rt L5/S1 deep mutlifidus with focused core ther ex for improved local stabilization.    Status Achieved               PT Long Term Goals - 11/19/20 0855       PT LONG TERM GOAL #1   Title Pt will be ind with advanced HEP and understand how to safely progress toward desired activities/work outs.    Baseline working on more dynamic ther ex and single leg stability    Status On-going      PT LONG TERM GOAL #2   Title Pt will report improved sitting tolerance by at least 70% throughout work day using intermittent breaks to change positions as able.    Baseline using support pillows and taking more standing breaks - improving but not quite 70% yet    Status On-going      PT LONG TERM GOAL #3   Title Pt will demo local activation and control of lumbar spine with dynamic functional movement patterns so she may try workout videos again (PT to use return to running ther ex list).    Status On-going                   Plan - 11/19/20  8032     Clinical Impression Statement Pt reports improved pain with sitting for work using more support  pillows and getting up more frequently to break up prolonged static sitting.  She continues to have delayed onset of stiffness and pain following dynamic physical work such as yard work.  PT encouraged Pt to alternate sides when shoveling for gardening tasks as she typically only uses one side.  PT advanced ther ex today given Pt's good pain control and goal of returning to dynamic exercise videos and goal of continuing with yard work.  She has great difficulty stabilizing Rt hip with single leg tasks.  She displays improving glut strength as she is tolerating more reps of SL hip abduction and step ups.  She was able to perform single leg fig 4 bridge but had a great deal of fatigue on Rt side with 5 reps.  PT gave fig 4 bridge for HEP to work towards increased reps.  PT using research based return to running criteria to work towards improved strength with good pain control to return to Pt's exercise videos.  She had onset of Rt sided paraspinal pain end of session which resolved with cat/cow and child's pose stretching.  Continue along POC with ongoing assessment of response to advanced stabilization and ther ex.    Comorbidities Hx of L3 fracture in 1995    PT Frequency 2x / week    PT Duration 8 weeks    PT Treatment/Interventions ADLs/Self Care Home Management;Spinal Manipulations;Joint Manipulations;Dry needling;Passive range of motion;Taping;Neuromuscular re-education;Therapeutic exercise;Therapeutic activities;Functional mobility training;Stair training;Electrical Stimulation;Moist Heat    PT Next Visit Plan do FOTO, continue more advanced lumbopelvic hip stabilization with single leg challenges in open and closed chain, return to running criteria    PT Home Exercise Plan Access Code: [email or web address]    Consulted and Agree with Plan of Care Patient             Patient will benefit from skilled therapeutic intervention in order to improve the following deficits and impairments:     Visit  Diagnosis: Chronic bilateral low back pain without sciatica  Muscle weakness (generalized)     Problem List Patient Active Problem List   Diagnosis Date Noted   Osteoarthritis 04/11/2020   Attention deficit hyperactivity disorder (ADHD) 10/10/2009   LOW BACK PAIN, CHRONIC 04/29/2009   SCOLIOSIS 04/29/2009    Jordyn Doane, PT 11/19/20 8:58 AM   [facility] [address], [place], [place], [address] Phone: [phone]   Fax:  [phone]  Name: VILA DORY MRN: [id number] Date of Birth: 1975/08/20

## 2020-11-29 ENCOUNTER — Encounter: Payer: 59 | Admitting: Physical Therapy

## 2020-12-04 ENCOUNTER — Ambulatory Visit: Payer: 59 | Admitting: Physical Therapy

## 2020-12-13 ENCOUNTER — Ambulatory Visit: Payer: 59 | Attending: Family Medicine | Admitting: Physical Therapy

## 2020-12-13 ENCOUNTER — Encounter: Payer: Self-pay | Admitting: Physical Therapy

## 2020-12-13 ENCOUNTER — Other Ambulatory Visit: Payer: Self-pay

## 2020-12-13 DIAGNOSIS — M6281 Muscle weakness (generalized): Secondary | ICD-10-CM | POA: Diagnosis present

## 2020-12-13 DIAGNOSIS — G8929 Other chronic pain: Secondary | ICD-10-CM | POA: Insufficient documentation

## 2020-12-13 DIAGNOSIS — M545 Low back pain, unspecified: Secondary | ICD-10-CM | POA: Insufficient documentation

## 2020-12-13 NOTE — Therapy (Signed)
[facility] [address], [place], [place], [address] Phone: [phone]   Fax:  [phone]  Physical Therapy Treatment and Discharge  Patient Details  Name: Toni Edwards MRN: [id number] Date of Birth: 09-07-1975 Referring Provider (PT): Laurey Morale, MD   Encounter Date: 12/13/2020   PT End of Session - 12/13/20 0755     Visit Number 7    Authorization Type UHC    Authorization Time Period through 05/31/21    Authorization - Visit Number 7    Authorization - Number of Visits 60    PT Start Time 4128    PT Stop Time 7867    PT Time Calculation (min) 42 min    Activity Tolerance Patient tolerated treatment well;No increased pain    Behavior During Therapy [facility] for tasks assessed/performed             Past Medical History:  Diagnosis Date   ADHD (attention deficit hyperactivity disorder)    Breast nodule    by exam and [place] in July 2010 seen by Dr Donne Hazel, resolved on its own   Fracture of lumbar vertebra [facility]) 1995   Genital condyloma, female    Headache    migraines   HSV-2 infection 1/14   Postpartum care following vaginal delivery (7/8) 12/07/2014   Scoliosis     History reviewed. No pertinent surgical history.  There were no vitals filed for this visit.   Subjective Assessment - 12/13/20 0757     Subjective I am ready to wrap up PT.  I am glad to have tools now to address back pain.  I still have pain with lifting/moving heavy items and may need to review lifting strategies.  Valla Leaver work is better tolerated lately including shoveling rock.    Pertinent History lumbar spine Fx in 1995    Limitations Sitting    How long can you sit comfortably? 2-3 hours, works from home and can't find good chair    Diagnostic tests umbar xray: scoliosis of lumbar spine apex to Rt, concavity of superior endplate L3, stable, mild degenerative changes L2/3, L3/4    Patient Stated Goals improved sitting tolerance, be  able to be more active and work out, bending to pick up something    Currently in Pain? Yes    Pain Score 4     Pain Location Back    Pain Orientation Right;Left    Pain Descriptors / Indicators Aching;Dull;Tightness    Pain Type Chronic pain    Pain Onset More than a month ago    Pain Frequency Intermittent    Aggravating Factors  lifting and moving heavy objects, sitting too long after activity    Pain Relieving Factors keep moving and not overdo                [facility] PT Assessment - 12/13/20 0001       Assessment   Medical Diagnosis M54.50,G89.29 (ICD-10-CM) - Chronic bilateral low back pain without sciatica    Referring Provider (PT) Laurey Morale, MD    Onset Date/Surgical Date --   years ago   Next MD Visit as needed    Prior Therapy no      Observation/Other Assessments   Focus on Therapeutic Outcomes (FOTO)  57% to 60%      Posture/Postural Control   Posture/Postural Control Postural limitations    Postural Limitations Right pelvic obliquity    Posture Comments Rt shoulder lower than Lt due  to scoliosis      Strength   Overall Strength Comments improved firing of Rt lumbar multifidi, difficulty stabilizing in Rt SLS      Flexibility   Hamstrings limited end range bil    Quadriceps improved to WNL                           [facility] Adult PT Treatment/Exercise - 12/13/20 0001       Therapeutic Activites    Therapeutic Activities Other Therapeutic Activities    Other Therapeutic Activities strategy for laundry set up to limit/avoid bend/lift/twist of laundry basket, keep pelvis and shoulders square and use functional squat training with focused core activation      Exercises   Exercises Lumbar;Knee/Hip;Shoulder      Lumbar Exercises: Stretches   Single Knee to Chest Stretch 1 rep;20 seconds    Lower Trunk Rotation 3 reps;10 seconds    Quad Stretch Left;Right;1 rep;30 seconds    Quad Stretch Limitations standing      Lumbar Exercises:  Aerobic   Elliptical L5 incline L10 x 5'      Lumbar Exercises: Supine   Bridge with [employer] 5 reps    Single Leg Bridge 5 reps   Rt only, Lt d/c'd due to pain   Straight Leg Raise 5 reps    Straight Leg Raises Limitations bil      Lumbar Exercises: Quadruped   Madcat/Old Horse Limitations Mad cat to child's pose, child's pose with SB, 2x 10 sec each way    Opposite Arm/Leg Raise Left arm/Right leg;Right arm/Left leg    Opposite Arm/Leg Raise Limitations 3 reps each leg ext only      Knee/Hip Exercises: Standing   Side Lunges Both;1 set;5 reps   slider   Side Lunges Limitations and backward slider lunge 1x5    Forward Step Up Step Height: 6"    Forward Step Up Limitations with knee driver, slow motion for control assessment    Functional Squat 5 sets    Functional Squat Limitations hold 15lb    Other Standing Knee Exercises dead lifts, mini range, 15lb x 5 reps                      PT Short Term Goals - 12/13/20 0756       PT SHORT TERM GOAL #1   Title Pt will be ind with initial HEP    Status Achieved      PT SHORT TERM GOAL #2   Title Pt will be educated on sitting posture for work station to improve sitting tolerance with intermittent breaks for work day without exacerbation of pain.    Status Achieved      PT SHORT TERM GOAL #3   Title Pt will be able to activate Rt L5/S1 deep mutlifidus with focused core ther ex for improved local stabilization.    Status Achieved               PT Long Term Goals - 12/13/20 0756       PT LONG TERM GOAL #1   Title Pt will be ind with advanced HEP and understand how to safely progress toward desired activities/work outs.    Status Achieved      PT LONG TERM GOAL #2   Title Pt will report improved sitting tolerance by at least 70% throughout work day using intermittent breaks to change positions as able.  Status Achieved      PT LONG TERM GOAL #3   Title Pt will demo local activation and control of  lumbar spine with dynamic functional movement patterns so she may try workout videos again (PT to use return to running ther ex list).    Status Achieved      PT LONG TERM GOAL #4   Title FOTO improved from 57% to 67% to demo improved function.    Baseline 60%    Status Partially Met                   Plan - 12/13/20 0838     Clinical Impression Statement Pt has met all STGs and most LTGs with exception of FOTO score, which did improve by 3% points from 57% to 60%.  Pt reports 70%+ improvement in sitting tolerance for work and with yard work.  She does have delayed onset of pain and stiffness with heavy lifting and housework but knows tools to use to address and reduce/eliminate pain when present.  PT reviewed bending and lifting strategies and body mechanics with core for safety and protection with laundry basket today as Pt works in a tight space for laundry.  Pt feels ready to d/c and has the knowledge and tools to safely progress back toward work out videos and ongoing demands of family and household activities.  She demos improved SLS control with a need for PT to cue limited excursion of end range mobility of lunges for protrection of joints and improved control of stabilizers.    Personal Factors and Comorbidities Comorbidity 1    Comorbidities Hx of L3 fracture in 1995    Examination-Activity Limitations Sit;Bend;Lift;Other    Examination-Participation Restrictions Occupation;Meal Prep;Laundry;Community Activity;Cleaning    Stability/Clinical Decision Making Stable/Uncomplicated    Clinical Decision Making Low    Rehab Potential Excellent    PT Frequency 1x / week   1x renewal with d/c today   PT Duration 2 weeks    PT Treatment/Interventions ADLs/Self Care Home Management;Spinal Manipulations;Joint Manipulations;Dry needling;Passive range of motion;Taping;Neuromuscular re-education;Therapeutic exercise;Therapeutic activities;Functional mobility training;Stair  training;Electrical Stimulation;Moist Heat    PT Next Visit Plan today was 1x renewal with d/c to HEP    PT Home Exercise Plan Access Code: [email or web address]    Consulted and Agree with Plan of Care Patient             Patient will benefit from skilled therapeutic intervention in order to improve the following deficits and impairments:  Decreased range of motion, Pain, Postural dysfunction, Decreased strength, Impaired flexibility, Hypomobility, Increased muscle spasms  Visit Diagnosis: Chronic bilateral low back pain without sciatica - Plan: PT plan of care cert/re-cert  Muscle weakness (generalized) - Plan: PT plan of care cert/re-cert     Problem List Patient Active Problem List   Diagnosis Date Noted   Osteoarthritis 04/11/2020   Attention deficit hyperactivity disorder (ADHD) 10/10/2009   LOW BACK PAIN, CHRONIC 04/29/2009   SCOLIOSIS 04/29/2009    PHYSICAL THERAPY DISCHARGE SUMMARY  Visits from Start of Care: 7  Current functional level related to goals / functional outcomes: Pt reported ready to d/c to HEP.   Remaining deficits: See above   Education / Equipment: HEP  Patient agrees to discharge. Patient goals were met. Patient is being discharged due to being pleased with the current functional level.  Venetia Night Walda Hertzog, PT 12/13/20 11:58 AM   [facility] [address] [place], [place], [address] Phone: [phone]  Fax:  [phone]  Name: Toni Edwards MRN: [id number] Date of Birth: Oct 26, 1975

## 2021-09-22 ENCOUNTER — Ambulatory Visit: Payer: 59 | Admitting: Family Medicine

## 2021-09-22 ENCOUNTER — Encounter: Payer: Self-pay | Admitting: Family Medicine

## 2021-09-22 VITALS — BP 108/76 | HR 52 | Temp 98.6°F | Wt 157.0 lb

## 2021-09-22 DIAGNOSIS — D171 Benign lipomatous neoplasm of skin and subcutaneous tissue of trunk: Secondary | ICD-10-CM | POA: Diagnosis not present

## 2021-09-22 DIAGNOSIS — M5442 Lumbago with sciatica, left side: Secondary | ICD-10-CM | POA: Diagnosis not present

## 2021-09-22 DIAGNOSIS — G8929 Other chronic pain: Secondary | ICD-10-CM

## 2021-09-22 MED ORDER — CELECOXIB 200 MG PO CAPS: 200.0000 mg | ORAL_CAPSULE | Freq: Two times a day (BID) | ORAL | 5 refills | Status: DC

## 2021-09-22 MED ORDER — PHENTERMINE HCL 37.5 MG PO CAPS: 37.5000 mg | ORAL_CAPSULE | ORAL | 1 refills | Status: DC

## 2021-09-22 NOTE — Progress Notes (Signed)
? ?  Subjective:  ? ? Patient ID: Toni Edwards, female    DOB: January 21, 1976, 46 y.o.   MRN: [id number] ? ?HPI ?Here for several issues. First she has been dealing with a left sided low back pain for the past 2 months. No recent trauma, but she has had low back pain ofr at least 10 years. The pain is worst when she coughs or sneezes, although bending over is also painful. She had used Celebrex in the past but she ran out when she switched pharmacies. She had a plain film in May 2022 which showed scoliosis and degenerative changes. Also she has a lipoma on the left side that we first checked 2 years ago. This is growing larger and it causes pain at times.  ? ? ?Review of Systems  ?Constitutional: Negative.   ?Respiratory: Negative.    ?Cardiovascular: Negative.   ?Musculoskeletal:  Positive for back pain.  ? ?   ?Objective:  ? Physical Exam ?Constitutional:   ?   General: She is not in acute distress. ?   Appearance: Normal appearance.  ?Cardiovascular:  ?   Rate and Rhythm: Normal rate and regular rhythm.  ?   Pulses: Normal pulses.  ?   Heart sounds: Normal heart sounds.  ?Pulmonary:  ?   Effort: Pulmonary effort is normal.  ?   Breath sounds: Normal breath sounds.  ?Musculoskeletal:  ?   Comments: Tender over the left lower back and left sciatic notch. The spine has full ROM, negative SLR.   ?Skin: ?   Comments: There is a baseball sized firm tender mobile mass under the skin at the left anterior axillary line   ?Neurological:  ?   Mental Status: She is alert.  ? ? ? ? ? ?   ?Assessment & Plan:  ?For the low back pain, we will set up a lumbar spine MRI. Refilled the Celebrex. For the lipoma, we will refer her to Surgery. ?Alysia Penna, MD ? ? ?

## 2021-10-03 ENCOUNTER — Telehealth: Payer: Self-pay

## 2021-10-03 NOTE — Telephone Encounter (Signed)
Pt message to Dr Sarajane Jews ?

## 2021-10-03 NOTE — Telephone Encounter (Signed)
I did the referral to Sports Medicine ?

## 2021-10-03 NOTE — Addendum Note (Signed)
Addended by: Alysia Penna A on: 10/03/2021 04:37 PM ? ? Modules accepted: Orders ? ?

## 2021-10-06 ENCOUNTER — Encounter: Payer: Self-pay | Admitting: Family Medicine

## 2021-10-07 NOTE — Telephone Encounter (Signed)
Spoke with pt aware that referral to sports medicine was placed, pt is already scheduled for appointment on 10/09/2021 ?

## 2021-10-08 NOTE — Progress Notes (Signed)
? ? Benito Mccreedy D.Merril Abbe ?[facility] ?[place] ?Phone: [phone] ?  ?Assessment and Plan:   ?  ?1. Other idiopathic scoliosis, lumbar region ?2. Chronic left-sided low back pain without sciatica ?3. Somatic dysfunction of lumbar region ?4. Somatic dysfunction of pelvic region ?5. Somatic dysfunction of sacral region ?- Chronic with exacerbation, initial sports medicine visit ?- Consistent with musculoskeletal dysfunction with underlying lumbar scoliosis with apex to the right ?- We will start conservative treatment plan with goal of overall decrease of pain severity and frequency ?- Start HEP for low back, core, posterior chain.  Offered physical therapy, which patient declines at this time, but could be considered at future office visit ?-- Start meloxicam 15 mg daily x2 weeks.  If still having pain after 2 weeks, complete 3rd-week of meloxicam. May use remaining meloxicam as needed once daily for pain control.  Do not to use additional NSAIDs while taking meloxicam.  May use Tylenol [phone] mg 2 to 3 times a day for breakthrough pain.  ? - Patient elects for initial OMT today.  Tolerated well per note below. ?- Decision today to treat with OMT was based on Physical Exam ? ?After verbal consent patient was treated with HVLA (high velocity low amplitude), ME (muscle energy), FPR (flex positional release), ST (soft tissue), PC/PD (Pelvic Compression/ Pelvic Decompression) techniques in sacral, lumbar, and pelvic areas. Patient tolerated the procedure well with improvement in symptoms.  Patient educated on potential side effects of soreness and recommended to rest, hydrate, and use Tylenol as needed for pain control.  ?Pertinent previous records reviewed include lumbar x-[place] 10/03/2020 ?  ?Follow Up: 3 weeks for reevaluation.  Could repeat OMT if patient found it beneficial.  Could start PT if necessary.  Could obtain advanced imaging if no improvement or  worsening of symptoms. ?  ?Subjective:   ?I, Pincus Badder, am serving as a Education administrator for [employer] ? ?Chief Complaint: left sided low back pain  ? ?HPI:  ? ?10/09/2021 ?Patient is a 46 year old female complaining of left sided low back pain. Patient states with a left sided low back pain for the past 6-7 months. No recent trauma, but she has had low back pain ofr at least 10 years. The pain is worst when she coughs or sneezes sharpe shooting pain. Was getting hip pain when she was dealing with the pain with coughing . Hx of broken back when she was 17 hx of scoliosis , is using motrin does nothing for the pain,  ? ?Relevant Historical Information: Lumbar scoliosis, chronic low back pain ? ?Additional pertinent review of systems negative. ? ? ?Current Outpatient Medications:  ?  celecoxib (CELEBREX) 200 MG capsule, Take 1 capsule (200 mg total) by mouth 2 (two) times daily., Disp: 60 capsule, Rfl: 5 ?  cyclobenzaprine (FLEXERIL) 10 MG tablet, Take 1 tablet (10 mg total) by mouth 3 (three) times daily as needed for muscle spasms., Disp: 60 tablet, Rfl: 5 ?  ibuprofen (ADVIL,MOTRIN) 800 MG tablet, Take 1 tablet (800 mg total) by mouth every 8 (eight) hours., Disp: 30 tablet, Rfl: 0 ?  IUD'S IU, by Intrauterine route., Disp: , Rfl:  ?  meloxicam (MOBIC) 15 MG tablet, Take 1 tablet (15 mg total) by mouth daily., Disp: 30 tablet, Rfl: 0 ?  phentermine 37.5 MG capsule, Take 1 capsule (37.5 mg total) by mouth every morning., Disp: 90 capsule, Rfl: 1  ? ?Objective:   ?  ?  Vitals:  ? 10/09/21 0818  ?BP: 132/80  ?Pulse: (!) 51  ?SpO2: 97%  ?Weight: 153 lb (69.4 kg)  ?Height: '5\' 4"'$  (1.626 m)  ?  ?  ?Body mass index is 26.26 kg/m?.  ?  ?Physical Exam:   ? ?Gen: Appears well, nad, nontoxic and pleasant ?Psych: Alert and oriented, appropriate mood and affect ?Neuro: sensation intact, strength is 5/5 in upper and lower extremities, muscle tone wnl ?Skin: no susupicious lesions or rashes ? ?Back - Normal skin, lumbar spine  with moderate curvature with apex to the right ?No tenderness to vertebral process palpation.   ?Paraspinous muscles are not tender and without spasm ?Straight leg raise negative ?Gait normal ? ?General: Well-appearing, cooperative, sitting comfortably in no acute distress.  ? ?OMT Physical Exam: ? ?ASIS Compression Test: Positive left ?Sacrum: Negative sphinx, NTTP bilateral sacral base ?Lumbar: nTTP paraspinal, L1-5 RRSL ?Pelvis: Left posterior innominate  ? ?Electronically signed by:  ?Benito Mccreedy D.Merril Abbe ?[facility] ?8:54 AM 10/09/21 ?

## 2021-10-09 ENCOUNTER — Other Ambulatory Visit: Payer: 59

## 2021-10-09 ENCOUNTER — Ambulatory Visit: Payer: 59 | Admitting: Sports Medicine

## 2021-10-09 VITALS — BP 132/80 | HR 51 | Ht 64.0 in | Wt 153.0 lb

## 2021-10-09 DIAGNOSIS — M545 Low back pain, unspecified: Secondary | ICD-10-CM

## 2021-10-09 DIAGNOSIS — M4126 Other idiopathic scoliosis, lumbar region: Secondary | ICD-10-CM

## 2021-10-09 DIAGNOSIS — M9905 Segmental and somatic dysfunction of pelvic region: Secondary | ICD-10-CM

## 2021-10-09 DIAGNOSIS — M9903 Segmental and somatic dysfunction of lumbar region: Secondary | ICD-10-CM | POA: Diagnosis not present

## 2021-10-09 DIAGNOSIS — M9904 Segmental and somatic dysfunction of sacral region: Secondary | ICD-10-CM

## 2021-10-09 DIAGNOSIS — G8929 Other chronic pain: Secondary | ICD-10-CM

## 2021-10-09 MED ORDER — MELOXICAM 15 MG PO TABS: 15.0000 mg | ORAL_TABLET | Freq: Every day | ORAL | 0 refills | Status: DC

## 2021-10-09 NOTE — Telephone Encounter (Signed)
This email says the MRI was approved, NOT denied ?

## 2021-10-09 NOTE — Patient Instructions (Addendum)
Good to see you  ? Start meloxicam 15 mg daily x2 weeks.  If still having pain after 2 weeks, complete 3rd-week of meloxicam. May use remaining meloxicam as needed once daily for pain control.  Do not to use additional NSAIDs while taking meloxicam.  May use Tylenol [phone] mg 2 to 3 times a day for breakthrough pain. ?Stop Celebrex and motrin  ?Low back posterior chain core  HEP ?3 week follow up  ? ?

## 2021-10-13 ENCOUNTER — Encounter: Payer: Self-pay | Admitting: Family Medicine

## 2022-08-04 ENCOUNTER — Encounter: Payer: Self-pay | Admitting: Family Medicine

## 2022-08-04 DIAGNOSIS — D171 Benign lipomatous neoplasm of skin and subcutaneous tissue of trunk: Secondary | ICD-10-CM

## 2022-08-05 NOTE — Telephone Encounter (Signed)
I sent in a new referral so hopefully she will hear from them soon

## 2022-08-18 ENCOUNTER — Encounter: Payer: Self-pay | Admitting: Family Medicine

## 2022-08-21 ENCOUNTER — Ambulatory Visit: Payer: 59 | Admitting: Family Medicine

## 2022-08-21 ENCOUNTER — Encounter: Payer: Self-pay | Admitting: Family Medicine

## 2022-08-21 VITALS — BP 110/78 | HR 52 | Temp 97.8°F | Wt 161.0 lb

## 2022-08-21 DIAGNOSIS — Z0000 Encounter for general adult medical examination without abnormal findings: Secondary | ICD-10-CM

## 2022-08-21 MED ORDER — CYCLOBENZAPRINE HCL 10 MG PO TABS: 10.0000 mg | ORAL_TABLET | Freq: Three times a day (TID) | ORAL | 5 refills | Status: DC | PRN

## 2022-08-21 MED ORDER — PHENTERMINE HCL 37.5 MG PO CAPS: 37.5000 mg | ORAL_CAPSULE | ORAL | 1 refills | Status: DC

## 2022-08-21 NOTE — Progress Notes (Signed)
   Subjective:    Patient ID: Toni Edwards, female    DOB: 10/05/1975, 47 y.o.   MRN: [id number]  HPI Here for a well exam. She feels well but she wants to lose some weight again. She used Phentermine quite successfully last year, and she wants to try it again. She is waiting to see Surgery about he lipoma on her left side.    Review of Systems  Constitutional: Negative.   HENT: Negative.    Eyes: Negative.   Respiratory: Negative.    Cardiovascular: Negative.   Gastrointestinal: Negative.   Genitourinary:  Negative for decreased urine volume, difficulty urinating, dyspareunia, dysuria, enuresis, flank pain, frequency, hematuria, pelvic pain and urgency.  Musculoskeletal: Negative.   Skin: Negative.   Neurological: Negative.  Negative for headaches.  Psychiatric/Behavioral: Negative.         Objective:   Physical Exam Constitutional:      General: She is not in acute distress.    Appearance: Normal appearance. She is well-developed.  HENT:     Head: Normocephalic and atraumatic.     Right Ear: External ear normal.     Left Ear: External ear normal.     Nose: Nose normal.     Mouth/Throat:     Pharynx: No oropharyngeal exudate.  Eyes:     General: No scleral icterus.    Conjunctiva/sclera: Conjunctivae normal.     Pupils: Pupils are equal, round, and reactive to light.  Neck:     Thyroid: No thyromegaly.     Vascular: No JVD.  Cardiovascular:     Rate and Rhythm: Normal rate and regular rhythm.     Heart sounds: Normal heart sounds. No murmur heard.    No friction rub. No gallop.  Pulmonary:     Effort: Pulmonary effort is normal. No respiratory distress.     Breath sounds: Normal breath sounds. No wheezing or rales.  Chest:     Chest wall: No tenderness.  Abdominal:     General: Bowel sounds are normal. There is no distension.     Palpations: Abdomen is soft. There is no mass.     Tenderness: There is no abdominal tenderness. There is no guarding or rebound.   Musculoskeletal:        General: No tenderness. Normal range of motion.     Cervical back: Normal range of motion and neck supple.  Lymphadenopathy:     Cervical: No cervical adenopathy.  Skin:    General: Skin is warm and dry.     Findings: No erythema or rash.  Neurological:     Mental Status: She is alert and oriented to person, place, and time.     Cranial Nerves: No cranial nerve deficit.     Motor: No abnormal muscle tone.     Coordination: Coordination normal.     Deep Tendon Reflexes: Reflexes are normal and symmetric. Reflexes normal.  Psychiatric:        Behavior: Behavior normal.        Thought Content: Thought content normal.        Judgment: Judgment normal.           Assessment & Plan:  Well exam. We discussed diet and exercise. Get fasting labs. Alysia Penna, MD

## 2022-08-26 ENCOUNTER — Other Ambulatory Visit: Payer: 59

## 2022-09-01 ENCOUNTER — Other Ambulatory Visit (INDEPENDENT_AMBULATORY_CARE_PROVIDER_SITE_OTHER): Payer: 59

## 2022-09-01 DIAGNOSIS — Z0000 Encounter for general adult medical examination without abnormal findings: Secondary | ICD-10-CM

## 2022-09-01 LAB — HEPATIC FUNCTION PANEL
ALT: 11 U/L (ref 0–35)
AST: 13 U/L (ref 0–37)
Albumin: 4.5 g/dL (ref 3.5–5.2)
Alkaline Phosphatase: 36 U/L — ABNORMAL LOW (ref 39–117)
Bilirubin, Direct: 0.1 mg/dL (ref 0.0–0.3)
Total Bilirubin: 0.4 mg/dL (ref 0.2–1.2)
Total Protein: 6.9 g/dL (ref 6.0–8.3)

## 2022-09-01 LAB — TSH: TSH: 2.7 u[IU]/mL (ref 0.35–5.50)

## 2022-09-01 LAB — CBC WITH DIFFERENTIAL/PLATELET
Basophils Absolute: 0.1 10*3/uL (ref 0.0–0.1)
Basophils Relative: 1.3 % (ref 0.0–3.0)
Eosinophils Absolute: 0.1 10*3/uL (ref 0.0–0.7)
Eosinophils Relative: 1 % (ref 0.0–5.0)
HCT: 38.4 % (ref 36.0–46.0)
Hemoglobin: 13 g/dL (ref 12.0–15.0)
Lymphocytes Relative: 35.3 % (ref 12.0–46.0)
Lymphs Abs: 2.6 10*3/uL (ref 0.7–4.0)
MCHC: 33.8 g/dL (ref 30.0–36.0)
MCV: 93.6 fl (ref 78.0–100.0)
Monocytes Absolute: 0.4 10*3/uL (ref 0.1–1.0)
Monocytes Relative: 5.9 % (ref 3.0–12.0)
Neutro Abs: 4.2 10*3/uL (ref 1.4–7.7)
Neutrophils Relative %: 56.5 % (ref 43.0–77.0)
Platelets: 338 10*3/uL (ref 150.0–400.0)
RBC: 4.11 Mil/uL (ref 3.87–5.11)
RDW: 14 % (ref 11.5–15.5)
WBC: 7.4 10*3/uL (ref 4.0–10.5)

## 2022-09-01 LAB — LIPID PANEL
Cholesterol: 190 mg/dL (ref 0–200)
HDL: 62 mg/dL (ref >39.00)
LDL Cholesterol: 105 mg/dL — ABNORMAL HIGH (ref 0–99)
NonHDL: 128.14
Total CHOL/HDL Ratio: 3
Triglycerides: 115 mg/dL (ref 0.0–149.0)
VLDL: 23 mg/dL (ref 0.0–40.0)

## 2022-09-01 LAB — BASIC METABOLIC PANEL
BUN: 11 mg/dL (ref 6–23)
CO2: 28 mEq/L (ref 19–32)
Calcium: 9.3 mg/dL (ref 8.4–10.5)
Chloride: 103 mEq/L (ref 96–112)
Creatinine, Ser: 0.93 mg/dL (ref 0.40–1.20)
GFR: 74.79 mL/min (ref >60.00)
Glucose, Bld: 88 mg/dL (ref 70–99)
Potassium: 3.8 mEq/L (ref 3.5–5.1)
Sodium: 136 mEq/L (ref 135–145)

## 2022-09-01 LAB — HEMOGLOBIN A1C: Hgb A1c MFr Bld: 5.2 % (ref 4.6–6.5)

## 2022-09-11 ENCOUNTER — Ambulatory Visit: Payer: 59 | Admitting: Family Medicine

## 2022-09-11 ENCOUNTER — Other Ambulatory Visit: Payer: Self-pay | Admitting: Surgery

## 2022-09-11 ENCOUNTER — Encounter: Payer: Self-pay | Admitting: Family Medicine

## 2022-09-11 VITALS — BP 118/78 | HR 54 | Temp 98.2°F | Wt 160.0 lb

## 2022-09-11 DIAGNOSIS — L989 Disorder of the skin and subcutaneous tissue, unspecified: Secondary | ICD-10-CM | POA: Diagnosis not present

## 2022-09-11 NOTE — Progress Notes (Signed)
   Subjective:    Patient ID: Toni Edwards, female    DOB: 06-21-1975, 47 y.o.   MRN: [id number]  HPI Here to check on a spot on her face that she first noticed a year ago. It does not bother her, and it has not changed in appearance.    Review of Systems  Constitutional: Negative.   Respiratory: Negative.    Cardiovascular: Negative.        Objective:   Physical Exam Constitutional:      Appearance: Normal appearance.  Cardiovascular:     Rate and Rhythm: Normal rate and regular rhythm.     Pulses: Normal pulses.     Heart sounds: Normal heart sounds.  Pulmonary:     Effort: Pulmonary effort is normal.     Breath sounds: Normal breath sounds.  Skin:    Comments: There is a 2 mm tan colored papular lesion on the left cheek near the nose  Neurological:     Mental Status: She is alert.           Assessment & Plan:  Skin lesion, possible basal cell cancer. Refer to Dermatology to evaluate.  Gershon Crane, MD

## 2022-11-02 ENCOUNTER — Other Ambulatory Visit: Payer: Self-pay | Admitting: Surgery

## 2022-11-10 ENCOUNTER — Encounter: Payer: Self-pay | Admitting: Family Medicine

## 2022-12-14 ENCOUNTER — Ambulatory Visit: Payer: 59 | Admitting: Family Medicine

## 2022-12-14 ENCOUNTER — Encounter: Payer: Self-pay | Admitting: Family Medicine

## 2022-12-14 VITALS — BP 110/80 | HR 54 | Temp 98.4°F | Wt 158.8 lb

## 2022-12-14 DIAGNOSIS — G5603 Carpal tunnel syndrome, bilateral upper limbs: Secondary | ICD-10-CM

## 2022-12-14 DIAGNOSIS — H93299 Other abnormal auditory perceptions, unspecified ear: Secondary | ICD-10-CM | POA: Diagnosis not present

## 2022-12-14 DIAGNOSIS — H93293 Other abnormal auditory perceptions, bilateral: Secondary | ICD-10-CM

## 2022-12-14 NOTE — Progress Notes (Signed)
   Subjective:    Patient ID: Toni Edwards, female    DOB: [birth year], 47 y.o.   MRN: [id number]  HPI Here for 2 issues. First for the past 3 months she has had intermittent numbness and tingling in both hands. They may have pain rarely. No color changes. 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 She has tried using wrist support bars and other adaptive equipment on the job, but nothing has helped. She takes Ibuprofen 800 mg once a day. The other issue is possible misophonia. She says for most of her adult life she has had intense reactions to noises such as lip smacking, clocks ticking, and the crinkling of plastic water bottles. This is especially a problem on her job. 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    Review of Systems  Constitutional: Negative.   Respiratory: Negative.    Cardiovascular: Negative.   Neurological:  Positive for numbness.  Psychiatric/Behavioral:  Positive for behavioral problems.        Objective:   Physical Exam Constitutional:      Appearance: Normal appearance.  Cardiovascular:     Rate and Rhythm: Normal rate and regular rhythm.     Pulses: Normal pulses.     Heart sounds: Normal heart sounds.  Pulmonary:     Effort: Pulmonary effort is normal.     Breath sounds: Normal breath sounds.  Musculoskeletal:     Comments: Both wrists and hands are normal on exam. Tinel's are negative.   Neurological:     Mental Status: She is alert.  Psychiatric:        Mood and Affect: Mood normal.        Behavior: Behavior normal.        Thought Content: Thought content normal.           Assessment & Plan:  She likely has carpal tunnel syndrome in both wrists. I advised her to wear wear wrist splints as much as possible, even in bed at night. She will increase the Ibuprofen to BID. We will arrange for nerve conduction tests. She also has misophonia. Since there is not  much she can do to adapt her workplace, I suggested she talk to a psychotherapist about this.  Gershon Crane, MD

## 2022-12-21 ENCOUNTER — Encounter: Payer: Self-pay | Admitting: Neurology

## 2022-12-22 ENCOUNTER — Other Ambulatory Visit: Payer: Self-pay

## 2022-12-22 DIAGNOSIS — R202 Paresthesia of skin: Secondary | ICD-10-CM

## 2022-12-24 ENCOUNTER — Encounter: Payer: Self-pay | Admitting: Family Medicine

## 2022-12-27 ENCOUNTER — Encounter: Payer: Self-pay | Admitting: Family Medicine

## 2022-12-27 ENCOUNTER — Encounter: Payer: Self-pay | Admitting: Neurology

## 2022-12-29 ENCOUNTER — Ambulatory Visit: Payer: 59 | Admitting: Neurology

## 2022-12-29 DIAGNOSIS — R202 Paresthesia of skin: Secondary | ICD-10-CM | POA: Diagnosis not present

## 2022-12-29 NOTE — Telephone Encounter (Signed)
No we need to focus on her hands and arms first

## 2022-12-29 NOTE — Procedures (Signed)
[facility]  [address] [place], [address]  [place], [place] [address] Tel: [phone] Fax: [phone] Test Date:  12/29/2022  Patient: Toni Edwards DOB: 1976-05-03 Physician: Jacquelyne Balint, MD  Sex: Female Height: 5\' 4"  Ref Phys: Gershon Crane, MD  ID#: [id number]   Technician:    History: This is a 47 year old female with numbness and tingling in her hands.  NCV & EMG Findings: Extensive electrodiagnostic evaluation of bilateral upper limbs shows: Bilateral median, ulnar, radial, and median-ulnar palmar sensory responses are within normal limits. Bilateral median (APB) and ulnar (ADM) motor responses are within normal limits. There is no evidence of active or chronic motor axon loss changes affecting any of the tested muscles on needle examination. Motor unit configuration and recruitment pattern is within normal limits.  Impression: This is a normal study. Specifically: No electrodiagnostic evidence of a right or left cervical (C5-T1) motor radiculopathy. No electrodiagnostic evidence of a right or left median mononeuropathy at or distal to the wrist (ie: carpal tunnel syndrome). Screening studies for right or left ulnar or radial mononeuropathies are normal.     ___________________________ Jacquelyne Balint, MD    Nerve Conduction Studies Motor Nerve Results    Latency Amplitude F-Lat Segment Distance CV Comment  Site (ms) Norm (mV) Norm (ms)  (cm) (m/s) Norm   Left Median (APB) Motor  Wrist 2.3  < 3.9 9.2  > 6.0        Elbow 7.1 - 9.2 -  Elbow-Wrist 28 58  > 50   Right Median (APB) Motor  Wrist 2.3  < 3.9 6.2  > 6.0        Elbow 7.2 - 6.2 -  Elbow-Wrist 27.5 56  > 50   Left Ulnar (ADM) Motor  Wrist 1.98  < 3.1 12.5  > 7.0        Bel elbow 5.4 - 11.1 -  Bel elbow-Wrist 20 59  > 50   Ab elbow 7.3 - 11.0 -  Ab elbow-Bel elbow 10 53 -   Right Ulnar (ADM) Motor  Wrist 1.55  < 3.1 10.2  > 7.0        Bel elbow 5.0 - 10.1 -  Bel elbow-Wrist 20.5 59  > 50   Ab elbow  6.8 - 10.0 -  Ab elbow-Bel elbow 10 56 -    Sensory Sites    Neg Peak Lat Amplitude (O-P) Segment Distance Velocity Comment  Site (ms) Norm (V) Norm  (cm) (ms)   Left Median Sensory  Wrist-Dig II 3.2  < 3.4 37  > 20 Wrist-Dig II 13    Right Median Sensory  Wrist-Dig II 2.9  < 3.4 30  > 20 Wrist-Dig II 13    Left Median-Ulnar Palmar Sensory       Median  Palm-Wrist 2.1  < 2.2 71  > 10 Palm-Wrist 8         Ulnar  Palm-Wrist 1.78  < 2.2 32  > 5 Palm-Wrist 8    Right Median-Ulnar Palmar Sensory       Median  Palm-Wrist 2.1  < 2.2 52  > 10 Palm-Wrist 8         Ulnar  Palm-Wrist 1.73  < 2.2 27  > 5 Palm-Wrist 8    Left Radial Sensory  Forearm-Wrist 2.1  < 2.7 30  > 18 Forearm-Wrist 10    Right Radial Sensory  Forearm-Wrist 2.1  < 2.7 33  > 18 Forearm-Wrist 10  Left Ulnar Sensory  Wrist-Dig V 2.7  < 3.1 29  > 12 Wrist-Dig V 11    Right Ulnar Sensory  Wrist-Dig V 2.5  < 3.1 38  > 12 Wrist-Dig V 11     Inter-Nerve Comparisons   Nerve 1 Value 1 Nerve 2 Value 2 Parameter Result Normal  Sensory Sites  R Median Palm-Wrist 2.1 ms R Ulnar Palm-Wrist 1.73 ms Peak Lat Diff 0.37 ms <0.40  L Median Palm-Wrist 2.1 ms L Ulnar Palm-Wrist 1.78 ms Peak Lat Diff 0.32 ms <0.40   Electromyography   Side Muscle Ins.Act Fibs Fasc Recrt Amp Dur Poly Activation Comment  Left FDI Nml Nml Nml Nml Nml Nml Nml Nml N/A  Left EIP Nml Nml Nml Nml Nml Nml Nml Nml N/A  Left APB Nml Nml Nml Nml Nml Nml Nml Nml N/A  Left Pronator teres Nml Nml Nml Nml Nml Nml Nml Nml N/A  Left Biceps Nml Nml Nml Nml Nml Nml Nml Nml N/A  Left Triceps Nml Nml Nml Nml Nml Nml Nml Nml N/A  Left Deltoid Nml Nml Nml Nml Nml Nml Nml Nml N/A  Right FDI Nml Nml Nml Nml Nml Nml Nml Nml N/A  Right EIP Nml Nml Nml Nml Nml Nml Nml Nml N/A  Right APB Nml Nml Nml Nml Nml Nml Nml Nml N/A  Right Pronator teres Nml Nml Nml Nml Nml Nml Nml Nml N/A  Right Biceps Nml Nml Nml Nml Nml Nml Nml Nml N/A  Right Triceps Nml Nml Nml Nml Nml Nml Nml  Nml N/A  Right Deltoid Nml Nml Nml Nml Nml Nml Nml Nml N/A      Waveforms:  Motor           Sensory

## 2023-01-07 ENCOUNTER — Other Ambulatory Visit: Payer: Self-pay

## 2023-01-07 DIAGNOSIS — G5603 Carpal tunnel syndrome, bilateral upper limbs: Secondary | ICD-10-CM

## 2023-01-18 ENCOUNTER — Encounter: Payer: Self-pay | Admitting: Family Medicine

## 2023-01-18 ENCOUNTER — Telehealth: Payer: 59 | Admitting: Family Medicine

## 2023-01-18 DIAGNOSIS — U071 COVID-19: Secondary | ICD-10-CM | POA: Diagnosis not present

## 2023-01-18 NOTE — Progress Notes (Signed)
E-Visit  for Positive Covid Test Result   We are sorry you are not feeling well. We are here to help!  You have tested positive for COVID-19, meaning that you were infected with the novel coronavirus and could give the virus to others.  Most people with COVID-19 have mild illness and can recover at home without medical care. Do not leave your home, except to get medical care. Do not visit public areas and do not go to places where you are unable to wear a mask. It is important that you stay home  to take care for yourself and to help protect other people in your home and community.      Isolation Instructions:   You are to isolate at home until you have been fever free for at least 24 hours without a fever-reducing medication, and symptoms have been steadily improving for 24 hours. At that time,  you can end isolation but need to mask for an additional 5 days.  If you must be around other household members who do not have symptoms, you need to make sure that both you and the family members are masking consistently with a high-quality mask.  If you note any worsening of symptoms despite treatment, please seek an in-person evaluation ASAP. If you note any significant shortness of breath or any chest pain, please seek ER evaluation. Please do not delay care!   Go to the nearest hospital ED for assessment if fever/cough/breathlessness are severe or illness seems like a threat to life.    The following symptoms may appear 2-14 days after exposure: Fever Cough Shortness of breath or difficulty breathing Chills Repeated shaking with chills Muscle pain Headache Sore throat New loss of taste or smell Fatigue Congestion or runny nose Nausea or vomiting Diarrhea   You may also take acetaminophen (Tylenol) as needed for fever.  HOME CARE: Only take medications as instructed by your medical team. Drink plenty of fluids and get plenty of rest. A steam or ultrasonic humidifier can help if  you have congestion.   GET HELP RIGHT AWAY IF YOU HAVE EMERGENCY WARNING SIGNS.  Call 911 or proceed to your closest emergency facility if: You develop worsening high fever. Trouble breathing Bluish lips or face Persistent pain or pressure in the chest New confusion Inability to wake or stay awake You cough up blood. Your symptoms become more severe Inability to hold down food or fluids  This list is not all possible symptoms. Contact your medical provider for any symptoms that are severe or concerning to you.   Your e-visit answers were reviewed by a board certified advanced clinical practitioner to complete your personal care plan.  Depending on the condition, your plan could have included both over the counter or prescription medications.  If there is a problem please reply once you have received a response from your provider.  Your safety is important to [place].  If you have drug allergies check your prescription carefully.    You can use MyChart to ask questions about today's visit, request a non-urgent call back, or ask for a work or school excuse for 24 hours related to this e-Visit. If it has been greater than 24 hours you will need to follow up with your provider, or enter a new e-Visit to address those concerns. You will get an e-mail in the next two days asking about your experience.  I hope that your e-visit has been valuable and will speed your recovery. Thank you for  using e-visits.     I provided 5 minutes of non face-to-face time during this encounter for chart review, medication and order placement, as well as and documentation.

## 2023-01-21 ENCOUNTER — Other Ambulatory Visit: Payer: Self-pay | Admitting: Family Medicine

## 2023-02-11 NOTE — Progress Notes (Deleted)
Initial neurology clinic note  Reason for Evaluation: Consultation requested by Nelwyn Salisbury, MD for an opinion regarding intermittent numbness and tingling of hands. My final recommendations will be communicated back to the requesting physician by way of shared medical record or letter to requesting physician via [place] mail.  HPI: This is Ms. Toni Edwards, a 47 y.o. ***-handed female with a medical history of ADHD, migraines who presents to neurology clinic with the chief complaint of intermittent numbness and tingling of hands. The patient is accompanied by ***.  *** Intermittent numbness and tingling of hands for ~5 months Works at a computer all day ?Also an issue in legs***  EMG of bilateral upper extremities completed by me on 12/29/22 was normal, including median-ulnar palmar studies.  The patient has not*** had similar episodes of symptoms in the past. ***  Muscle bulk loss? *** Muscle pain? ***  Cramps/Twitching? *** Suggestion of myotonia/difficulty relaxing after contraction? ***  Fatigable weakness?*** Does strength improve after brief exercise?***  Able to brush hair/teeth without difficulty? *** Able to button shirts/use zips? *** Clumsiness/dropping grasped objects?*** Can you arise from squatted position easily? *** Able to get out of chair without using arms? *** Able to walk up steps easily? *** Use an assistive device to walk? *** Significant imbalance with walking? *** Falls?*** Any change in urine color, especially after exertion/physical activity? ***  The patient denies*** symptoms suggestive of oculobulbar weakness including diplopia, ptosis, dysphagia, poor saliva control, dysarthria/dysphonia, impaired mastication, facial weakness/droop.  There are no*** neuromuscular respiratory weakness symptoms, particularly orthopnea>dyspnea.   Pseudobulbar affect is absent***.  The patient does not*** report symptoms referable to autonomic dysfunction including impaired  sweating, heat or cold intolerance, excessive mucosal dryness, gastroparetic early satiety, postprandial abdominal bloating, constipation, bowel or bladder dyscontrol, erectile dysfunction*** or syncope/presyncope/orthostatic intolerance.  There are no*** complaints relating to other symptoms of small fiber modalities including paresthesia/pain.  The patient has not *** noticed any recent skin rashes nor does he*** report any constitutional symptoms like fever, night sweats, anorexia or unintentional weight loss.  EtOH use: ***  Restrictive diet? *** Family history of neuropathy/myopathy/NM disease?***  Previous labs, electrodiagnostics, and neuroimaging are summarized below, but pertinent findings include***  Any biopsy done? *** Current medications being tried for the patient's symptoms include ***  Prior medications that have been tried: ***   MEDICATIONS:  Outpatient Encounter Medications as of 02/19/2023  Medication Sig   cyclobenzaprine (FLEXERIL) 10 MG tablet TAKE ONE TABLET BY MOUTH THREE TIMES DAILY AS NEEDED FOR MUSCLE SPASMS   ibuprofen (ADVIL,MOTRIN) 800 MG tablet Take 1 tablet (800 mg total) by mouth every 8 (eight) hours.   IUD'S IU by Intrauterine route.   phentermine 37.5 MG capsule Take 1 capsule (37.5 mg total) by mouth every morning.   No facility-administered encounter medications on file as of 02/19/2023.    PAST MEDICAL HISTORY: Past Medical History:  Diagnosis Date   ADHD (attention deficit hyperactivity disorder)    Breast nodule    by exam and [place] in July 2010 seen by Dr Dwain Sarna, resolved on its own   Fracture of lumbar vertebra (HCC) 1995   Genital condyloma, female    Headache    migraines   HSV-2 infection 1/14   Postpartum care following vaginal delivery (7/8) 12/07/2014   Scoliosis     PAST SURGICAL HISTORY: No past surgical history on file.  ALLERGIES: No Known Allergies  FAMILY HISTORY: Family History  Problem Relation Age of Onset  Alcohol abuse Other    Breast cancer Other    Hyperlipidemia Other    Breast cancer Mother 101   Cancer Maternal Grandmother        breast   Migraines Maternal Aunt     SOCIAL HISTORY: Social History   Tobacco Use   Smoking status: Former    Current packs/day: 0.00    Types: Cigarettes    Quit date: 04/01/2014    Years since quitting: 8.8   Smokeless tobacco: Never  Substance Use Topics   Alcohol use: Yes    Alcohol/week: 0.0 standard drinks of alcohol   Drug use: No   Social History   Social History Narrative   Not on file     OBJECTIVE: PHYSICAL EXAM: There were no vitals taken for this visit.  General:*** General appearance: Awake and alert. No distress. Cooperative with exam.  Skin: No obvious rash or jaundice. HEENT: Atraumatic. Anicteric. Lungs: Non-labored breathing on room air  Heart: Regular Abdomen: Soft, non tender. Extremities: No edema. No obvious deformity.  Musculoskeletal: No obvious joint swelling. Psych: Affect appropriate.  Neurological: Mental Status: Alert. Speech fluent. No pseudobulbar affect Cranial Nerves: CNII: No RAPD. Visual fields grossly intact. CNIII, IV, VI: PERRL. No nystagmus. EOMI. CN V: Facial sensation intact bilaterally to fine touch. [place] clench strong. Jaw jerk***. CN VII: Facial muscles symmetric and strong. No ptosis at rest or after sustained upgaze***. CN VIII: Hearing grossly intact bilaterally. CN IX: No hypophonia. CN X: Palate elevates symmetrically. CN XI: Full strength shoulder shrug bilaterally. CN XII: Tongue protrusion full and midline. No atrophy or fasciculations. No significant dysarthria*** Motor: Tone is ***. *** fasciculations in *** extremities. *** atrophy. No grip or percussive myotonia.***  Individual muscle group testing (MRC grade out of 5):  Movement     Neck flexion ***    Neck extension ***     Right Left   Shoulder abduction *** ***   Shoulder adduction *** ***   Shoulder  ext rotation *** ***   Shoulder int rotation *** ***   Elbow flexion *** ***   Elbow extension *** ***   Wrist extension *** ***   Wrist flexion *** ***   Finger abduction - FDI *** ***   Finger abduction - ADM *** ***   Finger extension *** ***   Finger distal flexion - 2/3 *** ***   Finger distal flexion - 4/5 *** ***   Thumb flexion - FPL *** ***   Thumb abduction - APB *** ***    Hip flexion *** ***   Hip extension *** ***   Hip adduction *** ***   Hip abduction *** ***   Knee extension *** ***   Knee flexion *** ***   Dorsiflexion *** ***   Plantarflexion *** ***   Inversion *** ***   Eversion *** ***   Great toe extension *** ***   Great toe flexion *** ***     Reflexes:  Right Left   Bicep *** ***   Tricep *** ***   BrRad *** ***   Knee *** ***   Ankle *** ***    Pathological Reflexes: Babinski: *** response bilaterally*** Hoffman: *** Troemner: *** Pectoral: *** Palmomental: *** Facial: *** Midline tap: *** Sensation: Pinprick: *** Vibration: *** Temperature: *** Proprioception: *** Coordination: Intact finger-to- nose-finger bilaterally. Romberg negative.*** Gait: Able to rise from chair with arms crossed unassisted. Normal, narrow-based gait. Able to tandem walk. Able to walk on toes and heels.***  Lab  and Test Review: Internal labs: 09/01/22: HbA1c: 5.2 CBC w/ diff unremarkable BMP unremarkable TSH wnl Hepatic function panel: unremarkable Lipid panel: Component     Latest Ref Rng 09/01/2022  Cholesterol     0 - 200 mg/dL 161   Triglycerides     0.0 - 149.0 mg/dL 096.0   HDL Cholesterol     >39.00 mg/dL 45.40   VLDL     0.0 - 40.0 mg/dL 98.1   LDL (calc)     0 - 99 mg/dL 191 (H)   Total CHOL/HDL Ratio 3   NonHDL 128.14      External labs: ***  Imaging: Lumbar spine xray (10/03/20): FINDINGS: IUD in the pelvis.   Scoliotic curvature of the lumbar spine, apex to the right. Straightening of normal lordosis. No other malalignment.  Concavity of the superior endplate of L3, stable since June of 2015. No other fractures. Mild degenerative changes at L2-3 and L3-4. No other abnormalities.   IMPRESSION: 1. Concavity of the superior endplate of L3, stable since 2015. 2. Scoliotic curvature of the lumbar spine, apex to the right. 3. Mild degenerative changes.  EMG (12/29/22): NCV & EMG Findings: Extensive electrodiagnostic evaluation of bilateral upper limbs shows: Bilateral median, ulnar, radial, and median-ulnar palmar sensory responses are within normal limits. Bilateral median (APB) and ulnar (ADM) motor responses are within normal limits. There is no evidence of active or chronic motor axon loss changes affecting any of the tested muscles on needle examination. Motor unit configuration and recruitment pattern is within normal limits.   Impression: This is a normal study. Specifically: No electrodiagnostic evidence of a right or left cervical (C5-T1) motor radiculopathy. No electrodiagnostic evidence of a right or left median mononeuropathy at or distal to the wrist (ie: carpal tunnel syndrome). Screening studies for right or left ulnar or radial mononeuropathies are normal. ***  ASSESSMENT: Toni Edwards is a 47 y.o. female who presents for evaluation of ***. *** has a relevant medical history of ***. *** neurological examination is pertinent for ***. Available diagnostic data is significant for ***. This constellation of symptoms and objective data would most likely localize to ***. ***  PLAN: -Blood work: *** ***  -Return to clinic ***  The impression above as well as the plan as outlined below were extensively discussed with the patient (in the company of ***) who voiced understanding. All questions were answered to their satisfaction.  The patient was counseled on pertinent fall precautions per the printed material provided today, and as noted under the "Patient Instructions" section below.***  When  available, results of the above investigations and possible further recommendations will be communicated to the patient via telephone/MyChart. Patient to call office if not contacted after expected testing turnaround time.   Total time spent reviewing records, interview, history/exam, documentation, and coordination of care on day of encounter:  *** min   Thank you for allowing me to participate in patient's care.  If I can answer any additional questions, I would be pleased to do so.  Jacquelyne Balint, MD   CC: Nelwyn Salisbury, MD [address] [place] [address]  CC: Referring provider: Nelwyn Salisbury, MD [address] [place],  [place] [address]

## 2023-02-11 NOTE — Progress Notes (Signed)
Initial neurology clinic note  Reason for Evaluation: Consultation requested by Nelwyn Salisbury, MD for an opinion regarding intermittent numbness and tingling of hands. My final recommendations will be communicated back to the requesting physician by way of shared medical record or letter to requesting physician via [place] mail.  HPI: This is Ms. Toni Edwards, a 47 y.o. right-handed female with a medical history of ADHD, migraines, scoliosis who presents to neurology clinic with the chief complaint of intermittent numbness and tingling of hands. The patient is alone today.  Patient first noticed symptoms in 12/2021. She was holding a ladder and almost dropped it, causing pain in both wrists, like a strain. She felt pain and weakness in her wrists. It got much better over a couple of months. She noticed ongoing symptoms while at her desk. In her right wrist, she would feel a achy sensation in her dorsal aspect of the wrist. She was more concerned about the left arm because she feels burning in an area on the medial aspect of the left wrist. She thinks this started from leaning on her desk. Just touching that area hurts. She saw her PCP who told her to wear carpal tunnel braces around 6-11/2022 due to concern for CTS. She had improvement in symptoms in her right wrist with the brace and stretching her wrist (wrist extension and wrist flexion stretching). She could not wear the brace on the left because the brace would touch the area that was hurting, so she could not tolerate. She denies neck pain.  She was sent for EMG, which I did on 12/29/22. EMG of bilateral upper extremities was normal, including median-ulnar palmar studies.  Patient also mentions pain in the top of her ankles. She feels pain (throbbing) in the anterior aspect of the ankle joint. She has tried different positions and shoes. She thinks it feels like a shin splint but in her ankle.  The patient denies symptoms suggestive of oculobulbar  weakness including diplopia, ptosis, dysphagia, poor saliva control, dysarthria/dysphonia, impaired mastication, facial weakness/droop.  The patient does not report symptoms referable to autonomic dysfunction including impaired sweating, heat or cold intolerance, excessive mucosal dryness, gastroparetic early satiety, postprandial abdominal bloating, constipation, bowel or bladder dyscontrol, or syncope/presyncope/orthostatic intolerance.  The patient has not noticed any recent skin rashes nor does she report any constitutional symptoms like fever, night sweats, anorexia or unintentional weight loss.  EtOH use: ~1 night per week, 6 beers  Restrictive diet? No Family history of neuropathy/myopathy/neurologic disease? No   MEDICATIONS:  Outpatient Encounter Medications as of 02/12/2023  Medication Sig   cyclobenzaprine (FLEXERIL) 10 MG tablet TAKE ONE TABLET BY MOUTH THREE TIMES DAILY AS NEEDED FOR MUSCLE SPASMS   ibuprofen (ADVIL,MOTRIN) 800 MG tablet Take 1 tablet (800 mg total) by mouth every 8 (eight) hours.   IUD'S IU by Intrauterine route.   phentermine 37.5 MG capsule Take 1 capsule (37.5 mg total) by mouth every morning. (Patient not taking: Reported on 02/12/2023)   No facility-administered encounter medications on file as of 02/12/2023.    PAST MEDICAL HISTORY: Past Medical History:  Diagnosis Date   ADHD (attention deficit hyperactivity disorder)    Breast nodule    by exam and [place] in July 2010 seen by Dr Dwain Sarna, resolved on its own   Fracture of lumbar vertebra [facility]) 1995   Genital condyloma, female    Headache    migraines   HSV-2 infection 1/14   Postpartum care following vaginal delivery (7/8) 12/07/2014  Scoliosis     PAST SURGICAL HISTORY: History reviewed. No pertinent surgical history.  ALLERGIES: No Known Allergies  FAMILY HISTORY: Family History  Problem Relation Age of Onset   Alcohol abuse Other    Breast cancer Other    Hyperlipidemia Other     Breast cancer Mother 13   Cancer Maternal Grandmother        breast   Migraines Maternal Aunt     SOCIAL HISTORY: Social History   Tobacco Use   Smoking status: Former    Current packs/day: 0.00    Types: Cigarettes    Quit date: 04/01/2014    Years since quitting: 8.8   Smokeless tobacco: Never  Vaping Use   Vaping status: Never Used  Substance Use Topics   Alcohol use: Yes    Comment: 6 drinks a week beer liquor and wine   Drug use: No   Social History   Social History Narrative   Are you right handed or left handed? Right   Are you currently employed ?    What is your current occupation?   Do you live at home alone?   Who lives with you?    What type of home do you live in: 1 story or 2 story?          OBJECTIVE: PHYSICAL EXAM: BP (!) 140/90   Pulse (!) 57   Ht 5\' 4"  (1.626 m)   Wt 161 lb (73 kg)   SpO2 100%   BMI 27.64 kg/m   General: General appearance: Awake and alert. No distress. Cooperative with exam.  Skin: No obvious rash or jaundice. HEENT: Atraumatic. Anicteric. Lungs: Non-labored breathing on room air  Extremities: No edema. Raised lesion on dorsum of right hand. Hard, not mobile. Patient says she just noticed it recently.  Musculoskeletal: No obvious joint swelling. Psych: Affect appropriate.  Neurological: Mental Status: Alert. Speech fluent. No pseudobulbar affect Cranial Nerves: CNII: No RAPD. Visual fields grossly intact. CNIII, IV, VI: PERRL. No nystagmus. EOMI. CN V: Facial sensation intact bilaterally to fine touch. CN VII: Facial muscles symmetric and strong. No ptosis at rest. CN VIII: Hearing grossly intact bilaterally. CN IX: No hypophonia. CN X: Palate elevates symmetrically. CN XI: Full strength shoulder shrug bilaterally. CN XII: Tongue protrusion full and midline. No atrophy or fasciculations. No significant dysarthria Motor: Tone is normal. No atrophy.  Individual muscle group testing (MRC grade out of  5):  Movement     Neck flexion 5    Neck extension 5     Right Left   Shoulder abduction 5 5   Shoulder adduction 5 5   Shoulder ext rotation 5 5   Shoulder int rotation 5 5   Elbow flexion 5 5   Elbow extension 5 5   Wrist extension 5 5   Wrist flexion 5 5   Finger abduction - FDI 5 5   Finger abduction - ADM 5 5   Finger extension 5 5   Finger distal flexion - 2/3 5 5    Finger distal flexion - 4/5 5 5    Thumb flexion - FPL 5 5   Thumb abduction - APB 5 5    Hip flexion 5 5   Hip extension 5 5   Hip adduction 5 5   Hip abduction 5 5   Knee extension 5 5   Knee flexion 5 5   Dorsiflexion 5 5   Plantarflexion 5 5   Inversion 5 5   Eversion  5 5   Great toe extension 5 5   Great toe flexion 5 5    Reflexes:  Right Left   Bicep 2+ 2+   Tricep 2+ 2+   BrRad 2+ 2+   Knee 2+ 2+   Ankle 2+ 2+    Pathological Reflexes: Babinski: flexor response bilaterally Hoffman: absent bilaterally Troemner: absent bilaterally Sensation: Pinprick: Intact in all extremities, including in left medial forearm. Phalen and Tinel's test negative at bilateral carpal tunnel. Coordination: Intact finger-to- nose-finger bilaterally. Romberg negative. Gait: Able to rise from chair with arms crossed unassisted. Normal, narrow-based gait. Able to tandem walk. Able to walk on toes and heels.  Lab and Test Review: Internal labs: 09/01/22: HbA1c: 5.2 CBC w/ diff unremarkable BMP unremarkable TSH wnl Hepatic function panel: unremarkable Lipid panel: Component     Latest Ref Rng 09/01/2022  Cholesterol     0 - 200 mg/dL 409   Triglycerides     0.0 - 149.0 mg/dL 811.9   HDL Cholesterol     >39.00 mg/dL 14.78   VLDL     0.0 - 40.0 mg/dL 29.5   LDL (calc)     0 - 99 mg/dL 621 (H)   Total CHOL/HDL Ratio 3   NonHDL 128.14    Imaging: Lumbar spine xray (10/03/20): FINDINGS: IUD in the pelvis.   Scoliotic curvature of the lumbar spine, apex to the right. Straightening of normal lordosis. No  other malalignment. Concavity of the superior endplate of L3, stable since June of 2015. No other fractures. Mild degenerative changes at L2-3 and L3-4. No other abnormalities.   IMPRESSION: 1. Concavity of the superior endplate of L3, stable since 2015. 2. Scoliotic curvature of the lumbar spine, apex to the right. 3. Mild degenerative changes.  EMG (12/29/22): NCV & EMG Findings: Extensive electrodiagnostic evaluation of bilateral upper limbs shows: Bilateral median, ulnar, radial, and median-ulnar palmar sensory responses are within normal limits. Bilateral median (APB) and ulnar (ADM) motor responses are within normal limits. There is no evidence of active or chronic motor axon loss changes affecting any of the tested muscles on needle examination. Motor unit configuration and recruitment pattern is within normal limits.   Impression: This is a normal study. Specifically: No electrodiagnostic evidence of a right or left cervical (C5-T1) motor radiculopathy. No electrodiagnostic evidence of a right or left median mononeuropathy at or distal to the wrist (ie: carpal tunnel syndrome). Screening studies for right or left ulnar or radial mononeuropathies are normal.   ASSESSMENT: Toni Edwards is a 47 y.o. female who presents for evaluation of bilateral wrist pain, left forearm pain, and bilateral anterior ankle pain. She has a relevant medical history of ADHD, migraines, scoliosis. Her neurological examination is normal today. Available diagnostic data is significant for EMG on 12/29/22 of bilateral upper extremities that was normal, including bilateral median-ulnar palmar sensory studies. The etiology of patient's symptoms are unclear, but do not appear neurologic. A musculoskeletal or rheumatologic etiology is possible.  PLAN: -Blood work: ANA, B1, B12 -Recommend patient see her sports medicine doctor, Dr. Jean Rosenthal  -Return to clinic as needed  The impression above as well as the plan as  outlined below were extensively discussed with the patient who voiced understanding. All questions were answered to their satisfaction.  When available, results of the above investigations and possible further recommendations will be communicated to the patient via telephone/MyChart. Patient to call office if not contacted after expected testing turnaround time.   Total time spent  reviewing records, interview, history/exam, documentation, and coordination of care on day of encounter:  50 min   Thank you for allowing me to participate in patient's care.  If I can answer any additional questions, I would be pleased to do so.  Jacquelyne Balint, MD   CC: Nelwyn Salisbury, MD [address] [place] [address]  CC: Referring provider: Nelwyn Salisbury, MD [address] [place],  [place] [address]

## 2023-02-12 ENCOUNTER — Encounter: Payer: Self-pay | Admitting: Neurology

## 2023-02-12 ENCOUNTER — Other Ambulatory Visit (INDEPENDENT_AMBULATORY_CARE_PROVIDER_SITE_OTHER): Payer: 59

## 2023-02-12 ENCOUNTER — Ambulatory Visit: Payer: 59 | Admitting: Neurology

## 2023-02-12 VITALS — BP 124/89 | HR 57 | Ht 64.0 in | Wt 161.0 lb

## 2023-02-12 DIAGNOSIS — M25532 Pain in left wrist: Secondary | ICD-10-CM

## 2023-02-12 DIAGNOSIS — M25572 Pain in left ankle and joints of left foot: Secondary | ICD-10-CM

## 2023-02-12 DIAGNOSIS — R209 Unspecified disturbances of skin sensation: Secondary | ICD-10-CM

## 2023-02-12 DIAGNOSIS — M4126 Other idiopathic scoliosis, lumbar region: Secondary | ICD-10-CM | POA: Diagnosis not present

## 2023-02-12 DIAGNOSIS — M25571 Pain in right ankle and joints of right foot: Secondary | ICD-10-CM

## 2023-02-12 DIAGNOSIS — M25531 Pain in right wrist: Secondary | ICD-10-CM

## 2023-02-12 LAB — VITAMIN B12: Vitamin B-12: 452 pg/mL (ref 211–911)

## 2023-02-12 NOTE — Patient Instructions (Signed)
I saw you today for the pain in your wrists, left forearm, and the tops of both ankles. I do not think these are neurologic in origin. We did extensive nerve testing and there is no evidence of carpal tunnel syndrome. Your symptoms sound more musculoskeletal in origin to me.  I recommend you reach out to your sports medicine doctor, Dr. Jean Rosenthal, to get an appointment and get his thoughts.  I will get some screening blood work today. We will be in touch when we have your results.  Please let me know if you have any questions or concerns in the meantime.  The physicians and staff at [facility] are committed to providing excellent care. You may receive a survey requesting feedback about your experience at our office. We strive to receive "very good" responses to the survey questions. If you feel that your experience would prevent you from giving the office a "very good " response, please contact our office to try to remedy the situation. We may be reached at [phone]. Thank you for taking the time out of your busy day to complete the survey.  Jacquelyne Balint, MD [facility]

## 2023-02-17 LAB — ANA: Anti Nuclear Antibody (ANA): NEGATIVE

## 2023-02-19 ENCOUNTER — Ambulatory Visit: Payer: 59 | Admitting: Neurology

## 2023-04-05 ENCOUNTER — Encounter: Payer: Self-pay | Admitting: Family Medicine

## 2023-04-08 MED ORDER — CELECOXIB 200 MG PO CAPS: 200.0000 mg | ORAL_CAPSULE | Freq: Every day | ORAL | 5 refills | Status: DC | PRN

## 2023-04-08 MED ORDER — CELECOXIB 200 MG PO CAPS: 200.0000 mg | ORAL_CAPSULE | Freq: Two times a day (BID) | ORAL | 5 refills | Status: DC

## 2023-04-08 NOTE — Addendum Note (Signed)
Addended by: Carola Rhine on: 04/08/2023 02:10 PM   Modules accepted: Orders

## 2023-04-08 NOTE — Telephone Encounter (Signed)
Pt notified via My Chart

## 2023-04-08 NOTE — Telephone Encounter (Signed)
Call in Celebrex 200 mg BID as needed for joint pain, #60 with 5 rf

## 2023-04-13 ENCOUNTER — Other Ambulatory Visit: Payer: Self-pay | Admitting: Family Medicine

## 2023-04-14 MED ORDER — CYCLOBENZAPRINE HCL 10 MG PO TABS
10.0000 mg | ORAL_TABLET | Freq: Three times a day (TID) | ORAL | 1 refills | Status: DC | PRN
  Filled 2023-04-14: qty 60, 20d supply, fill #0

## 2023-04-15 ENCOUNTER — Other Ambulatory Visit (HOSPITAL_COMMUNITY): Payer: Self-pay

## 2023-04-30 ENCOUNTER — Other Ambulatory Visit: Payer: Self-pay | Admitting: Family Medicine

## 2023-05-24 ENCOUNTER — Encounter: Payer: Self-pay | Admitting: Family Medicine

## 2023-05-24 ENCOUNTER — Ambulatory Visit: Payer: 59 | Admitting: Family Medicine

## 2023-05-24 VITALS — BP 120/82 | HR 80 | Temp 98.2°F | Wt 159.4 lb

## 2023-05-24 DIAGNOSIS — F900 Attention-deficit hyperactivity disorder, predominantly inattentive type: Secondary | ICD-10-CM

## 2023-05-24 DIAGNOSIS — M2550 Pain in unspecified joint: Secondary | ICD-10-CM | POA: Diagnosis not present

## 2023-05-24 MED ORDER — AMPHETAMINE-DEXTROAMPHET ER 10 MG PO CP24: 10.0000 mg | ORAL_CAPSULE | Freq: Every day | ORAL | 0 refills | Status: DC

## 2023-05-24 NOTE — Progress Notes (Signed)
   Subjective:    Patient ID: Toni Edwards, female    DOB: 1976-02-02, 47 y.o.   MRN: [id number]  HPI Here for 2 issues. First she asks to get back on medication for her ADHD. She took Adderall about 5 years ago, and she would like to try this again. Also she continues to have pain in multiple joints despite taking Celebrex BID.   Review of Systems  Constitutional: Negative.   Respiratory: Negative.    Cardiovascular: Negative.   Musculoskeletal:  Positive for arthralgias.  Psychiatric/Behavioral:  Positive for decreased concentration.        Objective:   Physical Exam Constitutional:      Appearance: Normal appearance.  Cardiovascular:     Rate and Rhythm: Normal rate and regular rhythm.     Pulses: Normal pulses.     Heart sounds: Normal heart sounds.  Pulmonary:     Effort: Pulmonary effort is normal.     Breath sounds: Normal breath sounds.  Neurological:     General: No focal deficit present.     Mental Status: She is alert and oriented to person, place, and time.           Assessment & Plan:  For the ADHD, she will try Adderall XR 10 mg each morning. For the joint pains, we will refer her to Rheumatology. Gershon Crane, MD

## 2023-06-16 ENCOUNTER — Encounter: Payer: Self-pay | Admitting: Family Medicine

## 2023-06-18 MED ORDER — AMPHETAMINE-DEXTROAMPHET ER 20 MG PO CP24: 20.0000 mg | ORAL_CAPSULE | ORAL | 0 refills | Status: DC

## 2023-06-18 NOTE — Telephone Encounter (Signed)
We can try the 20 mg dose

## 2023-07-26 ENCOUNTER — Other Ambulatory Visit: Payer: Self-pay | Admitting: Family Medicine

## 2023-07-27 MED ORDER — AMPHETAMINE-DEXTROAMPHET ER 20 MG PO CP24: 20.0000 mg | ORAL_CAPSULE | ORAL | 0 refills | Status: DC

## 2023-07-27 NOTE — Telephone Encounter (Signed)
 Done

## 2023-07-27 NOTE — Telephone Encounter (Signed)
 Pt LOV was on 05/24/23

## 2023-09-07 ENCOUNTER — Encounter: Payer: Self-pay | Admitting: Family Medicine

## 2023-09-07 NOTE — Telephone Encounter (Signed)
 Yes she can try these supplements

## 2023-10-31 ENCOUNTER — Encounter: Payer: Self-pay | Admitting: Family Medicine

## 2023-11-02 MED ORDER — AMPHETAMINE-DEXTROAMPHET ER 20 MG PO CP24: 20.0000 mg | ORAL_CAPSULE | ORAL | 0 refills | Status: DC

## 2023-11-02 NOTE — Telephone Encounter (Signed)
 Done

## 2024-02-04 ENCOUNTER — Encounter: Payer: Self-pay | Admitting: Family Medicine

## 2024-02-09 ENCOUNTER — Encounter: Payer: Self-pay | Admitting: Family Medicine

## 2024-02-11 MED ORDER — AMPHETAMINE-DEXTROAMPHET ER 20 MG PO CP24: 20.0000 mg | ORAL_CAPSULE | ORAL | 0 refills | Status: DC

## 2024-02-11 NOTE — Telephone Encounter (Signed)
 Done

## 2024-03-24 ENCOUNTER — Encounter: Payer: Self-pay | Admitting: Family Medicine

## 2024-03-24 MED ORDER — AMPHETAMINE-DEXTROAMPHET ER 20 MG PO CP24: 20.0000 mg | ORAL_CAPSULE | ORAL | 0 refills | Status: AC

## 2024-03-24 NOTE — Telephone Encounter (Signed)
 Done. This cannot be refilled automatically because it is controlled

## 2024-04-20 ENCOUNTER — Encounter: Payer: Self-pay | Admitting: Family Medicine

## 2024-04-20 ENCOUNTER — Other Ambulatory Visit: Payer: Self-pay | Admitting: Internal Medicine

## 2024-04-24 NOTE — Telephone Encounter (Signed)
 She already has refills until Jan 24
